# Patient Record
Sex: Male | Born: 1995
Health system: Southern US, Community
[De-identification: ages and names within clinical notes are randomized; demographics above are authoritative.]

## PROBLEM LIST (undated history)

## (undated) DIAGNOSIS — T7840XA Allergy, unspecified, initial encounter: Secondary | ICD-10-CM

## (undated) HISTORY — DX: Allergy, unspecified, initial encounter: T78.40XA

## (undated) HISTORY — PX: TONSILLECTOMY: SUR1361

## (undated) HISTORY — PX: FRACTURE SURGERY: SHX138

---
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
## 2014-06-23 ENCOUNTER — Ambulatory Visit
Admit: 2014-06-23 | Disposition: A | Discharge status: Home / Self Care | Payer: Self-pay | Attending: Surgery | Admitting: Surgery

## 2014-06-23 HISTORY — PX: OTHER SURGICAL HISTORY: SHX169
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
## 2014-08-10 ENCOUNTER — Ambulatory Visit: Payer: 59 | Attending: Surgery | Admitting: Physical Therapy

## 2014-08-10 ENCOUNTER — Encounter: Payer: Self-pay | Admitting: Physical Therapy

## 2014-08-10 DIAGNOSIS — M25672 Stiffness of left ankle, not elsewhere classified: Secondary | ICD-10-CM | POA: Diagnosis present

## 2014-08-10 DIAGNOSIS — M6281 Muscle weakness (generalized): Secondary | ICD-10-CM | POA: Diagnosis present

## 2014-08-11 ENCOUNTER — Ambulatory Visit: Payer: 59 | Admitting: Physical Therapy

## 2014-08-11 ENCOUNTER — Encounter: Payer: Self-pay | Admitting: Physical Therapy

## 2014-08-11 DIAGNOSIS — M6281 Muscle weakness (generalized): Secondary | ICD-10-CM

## 2014-08-11 DIAGNOSIS — M25672 Stiffness of left ankle, not elsewhere classified: Secondary | ICD-10-CM | POA: Diagnosis not present
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
## 2014-08-12 NOTE — Therapy (Signed)
[facility] [address] [place], [address] Phone: [phone]   Fax:  [phone]  Physical Therapy Treatment  Patient Details  Name: Kyle Foley MRN: [id number] Date of Birth: 03/23/1995 Referring Provider:  Adin Hector, MD  Encounter Date: 08/11/2014      PT End of Session - 08/11/14 1548    Visit Number 2   Number of Visits 12   Date for PT Re-Evaluation 09/22/14   PT Start Time 2778   PT Stop Time 1545   PT Time Calculation (min) 30 min      Past Medical History  Diagnosis Date  . Allergy     Past Surgical History  Procedure Laterality Date  . Left ankle orif Left 06/23/2014    There were no vitals filed for this visit.  Visit Diagnosis:  Ankle stiffness, left  Muscle weakness      Subjective Assessment - 08/11/14 1518    Subjective Rash seems to be better, still stiff in left ankle   Limitations Walking;Standing;House hold activities;Other (comment)  sports   Patient Stated Goals to return to prior level of function and improve mobility in left ankle for walking and daily tasks   Currently in Pain? No/denies     Observation: ambulating into clinic without assistive device, cast boot in place left LE, brought sneakers to change into for exercises + rash, improved from previous session along lower leg left to ankle Palpation: decreased soft tissue mobility along left ankle/scar and calf muslce       [facility] Adult PT Treatment/Exercise - 08/11/14 1530    Exercises   Exercises Other Exercises   Other Exercises  balance system for weight shifting x 2 min. side to side and forward and back with platform static and using UE for support/balance, random control exercise 2 sets each skill level with platform unlocked #7 (using UE support as needed for balance control),walk side stepping on balance foam x 1 min., walk forward and backwards with concentration on heel toe  pattern and equal step length with verbal cuing to perform correct technique and weight shifting   Manual Therapy   Manual Therapy Soft tissue mobilization   Manual therapy comments patient prone lying   Soft tissue mobilization STM posterior aspect of left ankle, calf muscle, scar mobilzation  Patient response: improved soft tissue elasticity, flexibility in Achilles tendon, improved AROM DF to 3 degrees, PF to 30 degrees and Inversion to 20 degrees          PT Education - 08/11/14 1518    Education provided Yes   Education Details Instructed in home exercises for balance exercises and walking side stepping and forwardk and backward walking   Person(s) Educated Patient   Methods Explanation;Demonstration;Verbal cues   Comprehension Verbalized understanding;Returned demonstration;Verbal cues required             PT Long Term Goals - 08/10/14 1305    PT LONG TERM GOAL #1   Title Patient will exhibit normal gait pattern with mild deviations without brace on left LE ankle by 09/02/2014   Baseline Patient is ambulating with crutches and brace on left LE   Status New   PT LONG TERM GOAL #2   Title Patient will improve LEFS to 56/80 demonstrating improved LE function with decreased pain/difficulty by 09/09/2014   Baseline LEFS 47/80   Status New   PT LONG TERM GOAL #3   Title Patient will improve left  ankle AROM to WNL's and close to right LE ankle ROM to improve abiltiy to ambulate without assistive device without deviations by 09/22/2014   Baseline AROM left ankle DF (-3), PF 20, inversion 18, eversion 15   Status New   PT LONG TERM GOAL #4   Title Patient will be independent with home program for pain/swelling control, ROM and strengthening left LE without cuing to be able to transition to self management by 09/22/2014   Baseline Patient has limited knowledge of appriate progression of exercises to improve flexiblity and strength left LE/ankle   Status New                Plan - 08/11/14 1548    Clinical Impression Statement Improved flexibility in left ankle to (+) 3 DF and 30 PF, 20 inversion and 10 eversion and improved gait pattern following treatment. He continues with limited ROM and strength and difficulty with walking and will benefit from continued physical therapy intervention to improve ROM and strength in order to achieve goals.    Pt will benefit from skilled therapeutic intervention in order to improve on the following deficits Abnormal gait;Decreased strength;Difficulty walking;Decreased range of motion;Decreased scar mobility   Rehab Potential Good   Clinical Impairments Affecting Rehab Potential (+) motivated, age, prior level of function   PT Frequency 2x / week   PT Duration 6 weeks   PT Treatment/Interventions Gait training;Manual techniques;Therapeutic exercise;Scar mobilization;Moist Heat;Patient/family education;Ultrasound   PT Next Visit Plan Instruct in AROM, standing/walking exercises        Problem List There are no active problems to display for this patient.   Jomarie Longs PT 08/12/2014, 2:28 PM  [place] [facility] [address], [place], [address] Phone: [phone]   Fax:  [phone]

## 2014-08-15 ENCOUNTER — Inpatient Hospital Stay
Admission: AD | Admit: 2014-08-15 | Discharge: 2014-08-19 | DRG: 858 | Disposition: A | Discharge status: Home / Self Care | Payer: 59 | Source: Ambulatory Visit | Attending: Surgery | Admitting: Surgery

## 2014-08-15 ENCOUNTER — Encounter: Payer: Self-pay | Admitting: *Deleted

## 2014-08-15 DIAGNOSIS — Y793 Surgical instruments, materials and orthopedic devices (including sutures) associated with adverse incidents: Secondary | ICD-10-CM | POA: Diagnosis present

## 2014-08-15 DIAGNOSIS — Z79899 Other long term (current) drug therapy: Secondary | ICD-10-CM

## 2014-08-15 DIAGNOSIS — K219 Gastro-esophageal reflux disease without esophagitis: Secondary | ICD-10-CM | POA: Diagnosis present

## 2014-08-15 DIAGNOSIS — Y838 Other surgical procedures as the cause of abnormal reaction of the patient, or of later complication, without mention of misadventure at the time of the procedure: Secondary | ICD-10-CM | POA: Diagnosis present

## 2014-08-15 DIAGNOSIS — T8149XA Infection following a procedure, other surgical site, initial encounter: Secondary | ICD-10-CM | POA: Diagnosis present

## 2014-08-15 DIAGNOSIS — T814XXA Infection following a procedure, initial encounter: Principal | ICD-10-CM | POA: Diagnosis present

## 2014-08-15 LAB — CBC
HCT: 37.7 % — ABNORMAL LOW (ref 40.0–52.0)
Hemoglobin: 12.9 g/dL — ABNORMAL LOW (ref 13.0–18.0)
MCH: 29.6 pg (ref 26.0–34.0)
MCHC: 34.1 g/dL (ref 32.0–36.0)
MCV: 86.7 fL (ref 80.0–100.0)
PLATELETS: 203 10*3/uL (ref 150–440)
RBC: 4.35 MIL/uL — ABNORMAL LOW (ref 4.40–5.90)
RDW: 12.3 % (ref 11.5–14.5)
WBC: 10.2 10*3/uL (ref 3.8–10.6)

## 2014-08-15 MED ORDER — KCL IN DEXTROSE-NACL 20-5-0.9 MEQ/L-%-% IV SOLN
INTRAVENOUS | Status: DC
  Administered 2014-08-15 – 2014-08-16 (×2): via INTRAVENOUS
  Filled 2014-08-15 (×5): qty 1000

## 2014-08-15 MED ORDER — MORPHINE SULFATE 2 MG/ML IJ SOLN
1.0000 mg | INTRAMUSCULAR | Status: DC | PRN
Start: 2014-08-15 — End: 2014-08-19
  Administered 2014-08-16: 2 mg via INTRAVENOUS
  Filled 2014-08-15: qty 1

## 2014-08-15 MED ORDER — ONDANSETRON HCL 4 MG/2ML IJ SOLN
4.0000 mg | Freq: Four times a day (QID) | INTRAMUSCULAR | Status: DC | PRN
  Administered 2014-08-18: 4 mg via INTRAVENOUS
  Filled 2014-08-15: qty 2

## 2014-08-15 MED ORDER — ACETAMINOPHEN 325 MG PO TABS: 650.0000 mg | ORAL_TABLET | Freq: Four times a day (QID) | ORAL | Status: DC | PRN

## 2014-08-15 MED ORDER — ACETAMINOPHEN 650 MG RE SUPP: 650.0000 mg | Freq: Four times a day (QID) | RECTAL | Status: DC | PRN

## 2014-08-15 MED ORDER — HYDROCODONE-ACETAMINOPHEN 5-325 MG PO TABS
1.0000 | ORAL_TABLET | ORAL | Status: DC | PRN
  Administered 2014-08-16: 2 via ORAL
  Administered 2014-08-16: 1 via ORAL
  Filled 2014-08-15: qty 1
  Filled 2014-08-15: qty 2

## 2014-08-15 MED ORDER — PANTOPRAZOLE SODIUM 40 MG IV SOLR
40.0000 mg | Freq: Every day | INTRAVENOUS | Status: DC
  Administered 2014-08-15: 40 mg via INTRAVENOUS
  Filled 2014-08-15: qty 40

## 2014-08-15 MED ORDER — CEFAZOLIN SODIUM-DEXTROSE 2-3 GM-% IV SOLR
2.0000 g | Freq: Three times a day (TID) | INTRAVENOUS | Status: DC
  Administered 2014-08-15 – 2014-08-19 (×12): 2 g via INTRAVENOUS
  Filled 2014-08-15 (×16): qty 50
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
## 2014-08-16 ENCOUNTER — Encounter: Payer: Self-pay | Admitting: Anesthesiology

## 2014-08-16 ENCOUNTER — Encounter
Admission: AD | Disposition: A | Discharge status: Home / Self Care | Payer: Self-pay | Source: Ambulatory Visit | Attending: Surgery

## 2014-08-16 ENCOUNTER — Inpatient Hospital Stay: Payer: 59 | Admitting: Certified Registered"

## 2014-08-16 HISTORY — PX: HARDWARE REMOVAL: SHX979

## 2014-08-16 SURGERY — REMOVAL, HARDWARE
Anesthesia: General | Laterality: Left | Wound class: Clean Contaminated

## 2014-08-16 MED ORDER — NEOSTIGMINE METHYLSULFATE 10 MG/10ML IV SOLN
INTRAVENOUS | Status: DC | PRN
  Administered 2014-08-16: 4 mg via INTRAVENOUS

## 2014-08-16 MED ORDER — PANTOPRAZOLE SODIUM 40 MG PO TBEC
40.0000 mg | DELAYED_RELEASE_TABLET | Freq: Every day | ORAL | Status: DC
  Administered 2014-08-17 – 2014-08-19 (×3): 40 mg via ORAL
  Filled 2014-08-16 (×3): qty 1

## 2014-08-16 MED ORDER — HYDROMORPHONE HCL 1 MG/ML IJ SOLN
0.2500 mg | INTRAMUSCULAR | Status: DC | PRN
  Administered 2014-08-16 (×4): 0.5 mg via INTRAVENOUS

## 2014-08-16 MED ORDER — OXYCODONE HCL 5 MG PO TABS
5.0000 mg | ORAL_TABLET | ORAL | Status: DC | PRN
  Administered 2014-08-16 (×2): 5 mg via ORAL
  Administered 2014-08-16 – 2014-08-19 (×12): 10 mg via ORAL
  Administered 2014-08-19: 5 mg via ORAL
  Filled 2014-08-16 (×3): qty 2
  Filled 2014-08-16 (×2): qty 1
  Filled 2014-08-16 (×4): qty 2
  Filled 2014-08-16: qty 1
  Filled 2014-08-16 (×7): qty 2

## 2014-08-16 MED ORDER — LACTATED RINGERS IV SOLN
INTRAVENOUS | Status: DC | PRN
  Administered 2014-08-16: 12:00:00 via INTRAVENOUS

## 2014-08-16 MED ORDER — GLYCOPYRROLATE 0.2 MG/ML IJ SOLN
INTRAMUSCULAR | Status: DC | PRN
  Administered 2014-08-16: 0.6 mg via INTRAVENOUS

## 2014-08-16 MED ORDER — SUCCINYLCHOLINE CHLORIDE 20 MG/ML IJ SOLN
INTRAMUSCULAR | Status: DC | PRN
  Administered 2014-08-16: 100 mg via INTRAVENOUS

## 2014-08-16 MED ORDER — FLUTICASONE PROPIONATE 50 MCG/ACT NA SUSP
2.0000 | Freq: Two times a day (BID) | NASAL | Status: DC | PRN
  Administered 2014-08-16 – 2014-08-18 (×2): 2 via NASAL
  Filled 2014-08-16: qty 16

## 2014-08-16 MED ORDER — MIDAZOLAM HCL 2 MG/2ML IJ SOLN
INTRAMUSCULAR | Status: DC | PRN
  Administered 2014-08-16: 3 mg via INTRAVENOUS

## 2014-08-16 MED ORDER — HYDROMORPHONE HCL 1 MG/ML IJ SOLN: 0.2500 mg | INTRAMUSCULAR | Status: DC | PRN

## 2014-08-16 MED ORDER — HYDROMORPHONE HCL 1 MG/ML IJ SOLN: 0.5000 mg | INTRAMUSCULAR | Status: DC | PRN

## 2014-08-16 MED ORDER — PROPOFOL 10 MG/ML IV BOLUS
INTRAVENOUS | Status: DC | PRN
  Administered 2014-08-16: 100 mg via INTRAVENOUS
  Administered 2014-08-16: 200 mg via INTRAVENOUS

## 2014-08-16 MED ORDER — FENTANYL CITRATE (PF) 100 MCG/2ML IJ SOLN
INTRAMUSCULAR | Status: DC | PRN
  Administered 2014-08-16: 50 ug via INTRAVENOUS
  Administered 2014-08-16: 100 ug via INTRAVENOUS
  Administered 2014-08-16: 50 ug via INTRAVENOUS
  Administered 2014-08-16: 100 ug via INTRAVENOUS

## 2014-08-16 MED ORDER — ONDANSETRON HCL 4 MG/2ML IJ SOLN: 4.0000 mg | Freq: Once | INTRAMUSCULAR | Status: AC | PRN

## 2014-08-16 MED ORDER — ONDANSETRON HCL 4 MG/2ML IJ SOLN
INTRAMUSCULAR | Status: DC | PRN
  Administered 2014-08-16: 4 mg via INTRAVENOUS

## 2014-08-16 MED ORDER — LIDOCAINE HCL (CARDIAC) 20 MG/ML IV SOLN
INTRAVENOUS | Status: DC | PRN
  Administered 2014-08-16: 100 mg via INTRAVENOUS

## 2014-08-16 MED ORDER — ROCURONIUM BROMIDE 100 MG/10ML IV SOLN
INTRAVENOUS | Status: DC | PRN
  Administered 2014-08-16: 10 mg via INTRAVENOUS
  Administered 2014-08-16: 20 mg via INTRAVENOUS

## 2014-08-16 MED ORDER — KCL IN DEXTROSE-NACL 20-5-0.9 MEQ/L-%-% IV SOLN
INTRAVENOUS | Status: DC
  Administered 2014-08-16 – 2014-08-17 (×2): via INTRAVENOUS
  Filled 2014-08-16 (×3): qty 1000

## 2014-08-16 MED ORDER — LORATADINE 10 MG PO TABS
10.0000 mg | ORAL_TABLET | Freq: Every day | ORAL | Status: DC
Start: 2014-08-16 — End: 2014-08-19
  Administered 2014-08-17 – 2014-08-18 (×2): 10 mg via ORAL
  Filled 2014-08-16 (×3): qty 1

## 2014-08-16 MED ORDER — NEOMYCIN-POLYMYXIN B GU 40-200000 IR SOLN
Status: DC | PRN
  Administered 2014-08-16: 1000 mL

## 2014-08-16 MED ORDER — DEXAMETHASONE SODIUM PHOSPHATE 4 MG/ML IJ SOLN
INTRAMUSCULAR | Status: DC | PRN
  Administered 2014-08-16: 10 mg via INTRAVENOUS

## 2014-08-16 SURGICAL SUPPLY — 49 items
BANDAGE ELASTIC 4 CLIP ST LF (GAUZE/BANDAGES/DRESSINGS) IMPLANT
BANDAGE ELASTIC 6 CLIP ST LF (GAUZE/BANDAGES/DRESSINGS) IMPLANT
BLADE SURG SZ10 CARB STEEL (BLADE) ×4 IMPLANT
BNDG COHESIVE 4X5 TAN STRL (GAUZE/BANDAGES/DRESSINGS) ×4 IMPLANT
BNDG ESMARK 6X12 TAN STRL LF (GAUZE/BANDAGES/DRESSINGS) ×2 IMPLANT
BNDG PLASTER FAST 4X5 WHT LF (CAST SUPPLIES) IMPLANT
CANISTER SUCT 1200ML W/VALVE (MISCELLANEOUS) ×2 IMPLANT
CHLORAPREP W/TINT 26ML (MISCELLANEOUS) IMPLANT
DRAIN PENROSE 1/4X12 LTX (DRAIN) ×2 IMPLANT
DRAPE C-ARM XRAY 36X54 (DRAPES) IMPLANT
DRAPE C-ARMOR (DRAPES) IMPLANT
DRAPE INCISE IOBAN 66X45 STRL (DRAPES) IMPLANT
DRAPE U-SHAPE 47X51 STRL (DRAPES) ×2 IMPLANT
ELECT CAUTERY BLADE 6.4 (BLADE) ×2 IMPLANT
GAUZE PETRO XEROFOAM 1X8 (MISCELLANEOUS) ×2 IMPLANT
GAUZE SPONGE 4X4 12PLY STRL (GAUZE/BANDAGES/DRESSINGS) ×2 IMPLANT
GAUZE XEROFORM 4X4 STRL (GAUZE/BANDAGES/DRESSINGS) ×2 IMPLANT
GLOVE BIO SURGEON STRL SZ8 (GLOVE) ×4 IMPLANT
GLOVE INDICATOR 8.0 STRL GRN (GLOVE) ×2 IMPLANT
GOWN STRL REUS W/ TWL LRG LVL3 (GOWN DISPOSABLE) ×1 IMPLANT
GOWN STRL REUS W/ TWL XL LVL3 (GOWN DISPOSABLE) ×2 IMPLANT
GOWN STRL REUS W/TWL LRG LVL3 (GOWN DISPOSABLE) ×1
GOWN STRL REUS W/TWL XL LVL3 (GOWN DISPOSABLE) ×2
HEMOVAC 400ML (MISCELLANEOUS)
KIT DRAIN HEMOVAC JP 7FR 400ML (MISCELLANEOUS) IMPLANT
LABEL OR SOLS (LABEL) ×2 IMPLANT
NS IRRIG 1000ML POUR BTL (IV SOLUTION) ×2 IMPLANT
PACK EXTREMITY ARMC (MISCELLANEOUS) ×2 IMPLANT
PAD ABD DERMACEA PRESS 5X9 (GAUZE/BANDAGES/DRESSINGS) ×4 IMPLANT
PAD CAST CTTN 4X4 STRL (SOFTGOODS) IMPLANT
PAD GROUND ADULT SPLIT (MISCELLANEOUS) ×2 IMPLANT
PAD PREP 24X41 OB/GYN DISP (PERSONAL CARE ITEMS) ×2 IMPLANT
PADDING CAST COTTON 4X4 STRL (SOFTGOODS)
SPONGE LAP 18X18 5 PK (GAUZE/BANDAGES/DRESSINGS) IMPLANT
STAPLER SKIN PROX 35W (STAPLE) ×2 IMPLANT
STOCKINETTE IMPERVIOUS LG (DRAPES) ×2 IMPLANT
STOCKINETTE M/LG 89821 (MISCELLANEOUS) IMPLANT
STOCKINETTE STRL 6IN 960660 (GAUZE/BANDAGES/DRESSINGS) IMPLANT
STRAP SAFETY BODY (MISCELLANEOUS) ×2 IMPLANT
SUT MON AB 3-0 SH 27 (SUTURE) ×2 IMPLANT
SUT PROLENE 0 CT 2 (SUTURE) ×2 IMPLANT
SUT PROLENE 4 0 PS 2 18 (SUTURE) IMPLANT
SUT VIC AB 0 CT1 36 (SUTURE) IMPLANT
SUT VIC AB 2-0 CT1 27 (SUTURE) ×1
SUT VIC AB 2-0 CT1 TAPERPNT 27 (SUTURE) ×1 IMPLANT
SUT VIC AB 2-0 SH 27 (SUTURE)
SUT VIC AB 2-0 SH 27XBRD (SUTURE) IMPLANT
SWAB DUAL CULTURE TRANS RED ST (MISCELLANEOUS) ×2 IMPLANT
SYRINGE 10CC LL (SYRINGE) ×2 IMPLANT

## 2014-08-16 NOTE — Anesthesia Procedure Notes (Signed)
Procedure Name: Intubation Date/Time: 08/16/2014 12:22 PM Performed by: Silvana Newness Pre-anesthesia Checklist: Patient identified, Emergency Drugs available, Suction available, Patient being monitored and Timeout performed Patient Re-evaluated:Patient Re-evaluated prior to inductionOxygen Delivery Method: Circle system utilized Preoxygenation: Pre-oxygenation with 100% oxygen Intubation Type: IV induction Ventilation: Mask ventilation without difficulty Laryngoscope Size: Mac and 4 Grade View: Grade I Tube type: Oral Tube size: 7.5 mm Number of attempts: 1 Airway Equipment and Method: Stylet Placement Confirmation: ETT inserted through vocal cords under direct vision,  positive ETCO2 and breath sounds checked- equal and bilateral Secured at: 24 cm Tube secured with: Tape Dental Injury: Teeth and Oropharynx as per pre-operative assessment

## 2014-08-16 NOTE — Anesthesia Preprocedure Evaluation (Addendum)
Anesthesia Evaluation  Patient identified by MRN, date of birth, ID band Patient awake    Reviewed: Allergy & Precautions, NPO status , Patient's Chart, lab work & pertinent test results  Airway Mallampati: III  TM Distance: <3 FB Neck ROM: Full   Comment: Small mouth Dental no notable dental hx. (+) Teeth Intact   Pulmonary neg pulmonary ROS,    Pulmonary exam normal       Cardiovascular negative cardio ROS Normal cardiovascular examRhythm:Regular Rate:Normal     Neuro/Psych negative neurological ROS  negative psych ROS   GI/Hepatic Neg liver ROS, GERD-  ,  Endo/Other  negative endocrine ROS  Renal/GU negative Renal ROS  negative genitourinary   Musculoskeletal negative musculoskeletal ROS (+)   Abdominal Normal abdominal exam  (+)   Peds negative pediatric ROS (+)  Hematology negative hematology ROS (+)   Anesthesia Other Findings Chronic sinus issues  Reproductive/Obstetrics                            Anesthesia Physical Anesthesia Plan  ASA: II  Anesthesia Plan: General   Post-op Pain Management:    Induction: Intravenous  Airway Management Planned: Oral ETT  Additional Equipment:   Intra-op Plan:   Post-operative Plan: Extubation in OR  Informed Consent: I have reviewed the patients History and Physical, chart, labs and discussed the procedure including the risks, benefits and alternatives for the proposed anesthesia with the patient or authorized representative who has indicated his/her understanding and acceptance.   Dental advisory given  Plan Discussed with: CRNA and Surgeon  Anesthesia Plan Comments:         Anesthesia Quick Evaluation

## 2014-08-16 NOTE — Transfer of Care (Signed)
Immediate Anesthesia Transfer of Care Note  Patient: Kyle Foley  Procedure(s) Performed: Procedure(s): HARDWARE REMOVAL (Left)  Patient Location: PACU  Anesthesia Type:General  Level of Consciousness: awake, alert , oriented and patient cooperative  Airway & Oxygen Therapy: Patient Spontanous Breathing and Patient connected to face mask oxygen  Post-op Assessment: Report given to RN, Post -op Vital signs reviewed and stable and Patient moving all extremities X 4  Post vital signs: Reviewed and stable  Last Vitals:  Filed Vitals:   08/16/14 1336  BP: 146/75  Pulse: 109  Temp: 37.6 C  Resp: 20    Complications: No apparent anesthesia complications

## 2014-08-16 NOTE — Anesthesia Postprocedure Evaluation (Signed)
  Anesthesia Post-op Note  Patient: Kyle Foley  Procedure(s) Performed: Procedure(s): HARDWARE REMOVAL (Left)  Anesthesia type:General  Patient location: PACU  Post pain: Pain level controlled  Post assessment: Post-op Vital signs reviewed, Patient's Cardiovascular Status Stable, Respiratory Function Stable, Patent Airway and No signs of Nausea or vomiting  Post vital signs: Reviewed and stable  Last Vitals:  Filed Vitals:   08/16/14 1643  BP: 150/74  Pulse: 87  Temp: 36.5 C  Resp: 18    Level of consciousness: awake, alert  and patient cooperative  Complications: No apparent anesthesia complications

## 2014-08-16 NOTE — Op Note (Signed)
08/15/2014 - 08/16/2014  1:36 PM  Patient:   Kyle Foley  Pre-Op Diagnosis:   Infected surgical wound status post ORIF left distal fibular fracture.  Post-Op Diagnosis:   Same  Procedure:   Irrigation and debridement with hardware removal left distal fibula.  Surgeon:   Pascal Lux, MD  Assistant:   None  Anesthesia:   General. endotracheal  Findings:   As above.  Complications:   None  EBL:   15 cc  Fluids:   650 cc crystalloid  TT:   38 minutes at 250 mmHg  Drains:   None  Closure:   #0 Proline  Implants:   None  Brief Clinical Note:   The patient is a 19 year old male who is now 7-8 weeks status post an open reduction and internal fixation of an unstable left distal fibular fracture. The patient was doing quite well until 4 days ago, when he began to notice pain, swelling, and erythema over the lateral aspect of his ankle in the area of his surgical incision. He became quite sick and began to run a temperature. The wound began to drain on Saturday. He was called in a prescription for by mouth antibiotics and then presented to the office on Monday. He was admitted for IV antibiotics and prepared for definitive management of his situation today.   Procedure: The patient was brought into the operating room and lain in the supine position. After adequate general endotracheal intubation and anesthesia were obtained, the patient's left foot and lower extremity were prepped with Betadine scrub and Betadine prep before being draped sterilely. Preoperative antibiotics were administered. The left lower extremity was exsanguinated with an Esmarch and the  Calf tourniquet inflated to 250 mmHg. The previous incision was reopened and carried down through the subcutaneous tissues to expose the plate. Abundant purulent material was identified and cultures were obtained. The hardware was removed in its entirety. The fracture itself was assessed and found to be stable, demonstrating  good interval healing. The screw holes were curetted using a small curette before the soft tissues themselves were scraped with a curette. The rind was removed from the lateral aspect of the fibula as well, using a key elevator. The wound was copiously irrigated with bacitracin saline solution before the wound was closed over a Penrose drain. Several 3-0 Monocryl interrupted sutures were used to close the deeper subcutaneous tissues while the skin was closed using #0 proline interrupted sutures. A sterile bulky dressing was applied to the wound before the patient was placed back into his cam walker boot. He was then awakened, extubated, and returned to the recovery room in satisfactory condition after tolerating the procedure well.

## 2014-08-16 NOTE — Plan of Care (Signed)
Problem: Problem: Pain Management Progression Goal: Pain Controlled (History of Substance Abuse) Outcome: Completed/Met Date Met:  08/16/14 No substance abuse

## 2014-08-16 NOTE — Care Management (Signed)
Met with patient and his dad to discuss discharge planning. Patient denies needs at this time. He has crutches available for mobility. He uses [facility] and denies difficulty obtaining meds. He is aware at that pharmacy is closed on weekend. He denies need for home health. RNCM will continue to follow.

## 2014-08-17 ENCOUNTER — Ambulatory Visit: Payer: 59 | Admitting: Physical Therapy

## 2014-08-17 MED ORDER — DOCUSATE SODIUM 100 MG PO CAPS
100.0000 mg | ORAL_CAPSULE | Freq: Two times a day (BID) | ORAL | Status: DC
  Administered 2014-08-17 – 2014-08-19 (×4): 100 mg via ORAL
  Filled 2014-08-17 (×4): qty 1

## 2014-08-17 MED ORDER — MAGNESIUM HYDROXIDE 400 MG/5ML PO SUSP
30.0000 mL | Freq: Every day | ORAL | Status: DC | PRN
  Administered 2014-08-17 – 2014-08-18 (×2): 30 mL via ORAL
  Filled 2014-08-17 (×2): qty 30

## 2014-08-17 MED FILL — Sodium Chloride Irrigation Soln 0.9%: Qty: 1000 | Status: AC

## 2014-08-17 MED FILL — Neomycin-Polymyxin B GU Irrigation Soln: Qty: 1 | Status: AC

## 2014-08-17 NOTE — Progress Notes (Signed)
  Subjective: 1 Day Post-Op Procedure(s) (LRB): HARDWARE REMOVAL (Left) Patient reports pain as moderate.   Patient seen in rounds with Dr. Roland Rack. Patient is well, and has had no acute complaints or problems. It has been getting up with crutches to go the bathroom Plan is to go Home after hospital stay.  Plan is to go Friday after the IV antibiotics are completed. Negative for chest pain and shortness of breath Fever: no Gastrointestinal:negative for nausea and vomiting  Objective: Vital signs in last 24 hours: Temp:  [97.7 F (36.5 C)-99.6 F (37.6 C)] 98.2 F (36.8 C) (06/08 0605) Pulse Rate:  [63-125] 63 (06/08 0605) Resp:  [18-20] 18 (06/08 0605) BP: (137-158)/(58-77) 137/62 mmHg (06/08 0605) SpO2:  [98 %-100 %] 99 % (06/08 0605) FiO2 (%):  [28 %] 28 % (06/07 1348)  Intake/Output from previous day:  Intake/Output Summary (Last 24 hours) at 08/17/14 0731 Last data filed at 08/16/14 2328  Gross per 24 hour  Intake   1000 ml  Output   1955 ml  Net   -955 ml    Intake/Output this shift:    Labs:  Recent Labs  08/15/14 1617  HGB 12.9*    Recent Labs  08/15/14 1617  WBC 10.2  RBC 4.35*  HCT 37.7*  PLT 203   No results for input(s): NA, K, CL, CO2, BUN, CREATININE, GLUCOSE, CALCIUM in the last 72 hours. No results for input(s): LABPT, INR in the last 72 hours.   EXAM General - Patient is Alert and Oriented Extremity - Neurologically intact Sensation intact distally Dressing/Incision - clean, blood tinged drainage on Ace wrap, that is minimal. Motor Function - intact, moving foot and toes well on exam.   Past Medical History  Diagnosis Date  . Allergy     Assessment/Plan: 1 Day Post-Op Procedure(s) (LRB): HARDWARE REMOVAL (Left) Active Problems:   Wound, surgical, infected  Estimated body mass index is 26.27 kg/(m^2) as calculated from the following:   Height as of this encounter: 5\' 9"  (1.753 m).   Weight as of this encounter: 80.74 kg (178  lb). Advance diet Continue ABX therapy due to Post-op infection  DVT Prophylaxis - None Weight-Bearing as tolerated to left leg  Reche Dixon, PA-C Orthopaedic Surgery 08/17/2014, 7:31 AM

## 2014-08-17 NOTE — Evaluation (Signed)
Physical Therapy Evaluation Patient Details Name: Kyle Foley MRN: [id number] DOB: 1995-04-22 Today's Date: 08/17/2014   History of Present Illness  Patient is a 19 y/o male that presents s/p left fibular hardware removal secondary to infection. Patient fractured his left fibula and had hardware placed on 06/23/14 and had just begun outpatient PT prior to developing significant pain in his LLE.   Clinical Impression  Patient is a 19 y/o male that presents after hardware removal from initial injury on 06/23/14. He is modified independent with crutches currently as he does not want to weight bear on his LLE secondary to pain, though he is considered WBAT. Patient displays excellent mechanics with mobility with crutches, and is safe with all transfers. Patient would continue to benefit from skilled PT services to address his mobility and balance deficits secondary to pain and increase his tolerance for weight bearing through his LLE.     Follow Up Recommendations Home health PT (May be viable candidate for Outpatient pending his tolerance for weight bearing)    Equipment Recommendations       Recommendations for Other Services       Precautions / Restrictions Precautions Precautions: None Restrictions Weight Bearing Restrictions: Yes LLE Weight Bearing: Weight bearing as tolerated      Mobility  Bed Mobility Overal bed mobility: Independent                Transfers Overall transfer level: Modified independent Equipment used: Crutches                Ambulation/Gait Ambulation/Gait assistance: Modified independent (Device/Increase time) Ambulation Distance (Feet): 200 Feet Assistive device: Crutches Gait Pattern/deviations: WFL(Within Functional Limits) (Hop through pattern with RLE and crutches )     General Gait Details: Patient was mod I with cruthces and did not want to weight bear on LLE secondary to pain   Stairs            Wheelchair  Mobility    Modified Rankin (Stroke Patients Only)       Balance Overall balance assessment: Independent                                           Pertinent Vitals/Pain Pain Assessment:  (Patient does not rate pain at start, but does not want to weight bear on LLE)    Home Living Family/patient expects to be discharged to:: Private residence Living Arrangements: Parent Available Help at Discharge: Family Type of Home: House Home Access: Stairs to enter Entrance Stairs-Rails: None Entrance Stairs-Number of Steps: 2 Home Layout: Two level Home Equipment: Crutches      Prior Function Level of Independence: Independent         Comments: Had just been cleared to ambulate without crutches last week, but prior to that was independent with cruthces.      Hand Dominance        Extremity/Trunk Assessment   Upper Extremity Assessment: Overall WFL for tasks assessed           Lower Extremity Assessment: Overall WFL for tasks assessed      Cervical / Trunk Assessment: Normal  Communication   Communication: No difficulties  Cognition Arousal/Alertness: Awake/alert Behavior During Therapy: WFL for tasks assessed/performed Overall Cognitive Status: Within Functional Limits for tasks assessed  General Comments      Exercises Total Joint Exercises Straight Leg Raises: AROM;Both;15 reps Bridges: AROM;Both;15 reps General Exercises - Lower Extremity Long Arc Quad: AROM;Both;15 reps Heel Slides: AROM;Both;15 reps      Assessment/Plan    PT Assessment Patient needs continued PT services  PT Diagnosis Difficulty walking;Abnormality of gait;Acute pain   PT Problem List Decreased strength;Pain;Decreased mobility;Decreased balance  PT Treatment Interventions Therapeutic activities;Therapeutic exercise;Gait training;Stair training   PT Goals (Current goals can be found in the Care Plan section) Acute Rehab PT  Goals PT Goal Formulation: With patient Time For Goal Achievement: 08/31/14 Potential to Achieve Goals: Good    Frequency BID   Barriers to discharge        Co-evaluation               End of Session Equipment Utilized During Treatment: Gait belt Activity Tolerance: Patient tolerated treatment well Patient left: in bed;with bed alarm set;with call bell/phone within reach;with family/visitor present Nurse Communication: Mobility status         Time: 7628-3151 PT Time Calculation (min) (ACUTE ONLY): 17 min   Charges:   PT Evaluation $Initial PT Evaluation Tier I: 1 Procedure PT Treatments $Therapeutic Exercise: 8-22 mins   PT G Codes:       Kerman Passey, PT, DPT   08/17/2014, 1:30 PM

## 2014-08-17 NOTE — Progress Notes (Signed)
Physical Therapy Treatment Patient Details Name: Kyle Foley MRN: [id number] DOB: [birth year] Today's Date: 08/17/2014    History of Present Illness Patient is a 19 y/o male that presents s/p left fibular hardware removal secondary to infection. Patient fractured his left fibula and had hardware placed on 06/23/14 and had just begun outpatient PT prior to developing significant pain in his LLE.     PT Comments    Patient was able to tolerate weight bearing better this afternoon, though he is still hesitant to ambulate with WB on RLE. He was able to complete 1 lap with WB on LLE with crutches with no loss of balance mod I. Patient ascended and descended 12 steps with either bilateral railing or his crutches with cga x 1 for safety. Patient would benefit from additional there-ex programming now that he is tolerating weight bearing on his LLE. Skilled acute PT services are indicated to continue to address his change in mobility status after hardware removal.  Follow Up Recommendations  Home health PT     Equipment Recommendations       Recommendations for Other Services       Precautions / Restrictions Precautions Precautions: None Restrictions Weight Bearing Restrictions: Yes LLE Weight Bearing: Weight bearing as tolerated    Mobility  Bed Mobility Overal bed mobility: Independent                Transfers Overall transfer level: Modified independent Equipment used: Crutches                Ambulation/Gait Ambulation/Gait assistance: Modified independent (Device/Increase time) Ambulation Distance (Feet): 500 Feet Assistive device: Crutches Gait Pattern/deviations: WFL(Within Functional Limits)     General Gait Details: Patient completed 1 full lap around RN station while partially weightbearing through LLE, though limited by pain. Initially patient preferred to ambulate only with LLE in hop to gait pattern.    Stairs Stairs: Yes Stairs assistance:  Modified independent (Device/Increase time) Stair Management: Two rails;With crutches Number of Stairs: 12 General stair comments: Patient able to complete with TTWB on LLE for third set of 4 steps and appeared much safer than with her hand rails or crutches on first 2 attempts.   Wheelchair Mobility    Modified Rankin (Stroke Patients Only)       Balance Overall balance assessment: Independent                                  Cognition Arousal/Alertness: Awake/alert Behavior During Therapy: WFL for tasks assessed/performed Overall Cognitive Status: Within Functional Limits for tasks assessed                      Exercises Total Joint Exercises Straight Leg Raises: AROM;Both;15 reps Bridges: AROM;15 reps General Exercises - Lower Extremity Long Arc Quad: AROM;Both;15 reps Heel Slides: AROM;Both;15 reps    General Comments        Pertinent Vitals/Pain Pain Assessment: 0-10 Pain Score: 4  Pain Location: Left ankle/foot region  Pain Intervention(s): Monitored during session;Utilized relaxation techniques;Limited activity within patient's tolerance    Home Living Family/patient expects to be discharged to:: Private residence Living Arrangements: Parent Available Help at Discharge: Family Type of Home: House Home Access: Stairs to enter Entrance Stairs-Rails: None Home Layout: Two level Home Equipment: Crutches      Prior Function Level of Independence: Independent      Comments: Had just been cleared to ambulate  without crutches last week, but prior to that was independent with cruthces.    PT Goals (current goals can now be found in the care plan section) Acute Rehab PT Goals PT Goal Formulation: With patient Time For Goal Achievement: 08/31/14 Potential to Achieve Goals: Good Progress towards PT goals: Progressing toward goals    Frequency  BID    PT Plan Current plan remains appropriate    Co-evaluation             End  of Session Equipment Utilized During Treatment: Gait belt Activity Tolerance: Patient tolerated treatment well Patient left: in bed;with bed alarm set;with call bell/phone within reach;with family/visitor present     Time: 1521-1540 PT Time Calculation (min) (ACUTE ONLY): 19 min  Charges:  $Gait Training: 8-22 mins                    G Codes:     Kerman Passey, PT, DPT   08/17/2014, 5:02 PM

## 2014-08-17 NOTE — Progress Notes (Signed)
Notified Dr Tamala Julian for medication for constipation. Colace and MOM ordered

## 2014-08-18 ENCOUNTER — Ambulatory Visit: Payer: 59 | Admitting: Physical Therapy

## 2014-08-18 MED FILL — Neomycin-Polymyxin B GU Irrigation Soln: Qty: 4 | Status: AC

## 2014-08-18 MED FILL — Sodium Chloride Irrigation Soln 0.9%: Qty: 1000 | Status: AC

## 2014-08-18 NOTE — Progress Notes (Signed)
Physical Therapy Treatment Patient Details Name: Kyle Foley MRN: [id number] DOB: February 24, 1996 Today's Date: 08/18/2014    History of Present Illness Patient is a 19 y/o male that presents s/p left fibular hardware removal secondary to infection. Patient fractured his left fibula and had hardware placed on 06/23/14 and had just begun outpatient PT prior to developing significant pain in his LLE.     PT Comments    Patient is able to weight bear through his LLE much better than previous sessions and provides heel strike and rolls through his left foot during gait. He has excellent balance and speed with crutches and does not have any fatigue during ambulation. He continues to struggle with single leg weight bearing with exercises, secondary to tenderness. Skilled acute PT services are indicated to address the above deficits.   Follow Up Recommendations  Outpatient PT     Equipment Recommendations       Recommendations for Other Services       Precautions / Restrictions Precautions Precautions: None Restrictions Weight Bearing Restrictions: Yes LLE Weight Bearing: Weight bearing as tolerated    Mobility  Bed Mobility Overal bed mobility: Independent                Transfers Overall transfer level: Modified independent Equipment used: Crutches                Ambulation/Gait Ambulation/Gait assistance: Modified independent (Device/Increase time) Ambulation Distance (Feet): 500 Feet Assistive device: Crutches       General Gait Details: Patient displayed heel strike and was able to roll through his LLE during stance phase. He still flexes his knee during stance to compensate for the boot, but appears more [facility] during this session than previous.    Stairs            Wheelchair Mobility    Modified Rankin (Stroke Patients Only)       Balance Overall balance assessment: Independent                                  Cognition  Arousal/Alertness: Awake/alert Behavior During Therapy: WFL for tasks assessed/performed Overall Cognitive Status: Within Functional Limits for tasks assessed                      Exercises Total Joint Exercises Straight Leg Raises: AROM;Both;20 reps Bridges: AROM;Both;20 reps General Exercises - Lower Extremity Hip Flexion/Marching: AROM;Both;10 reps Other Exercises Other Exercises: Standing hip extensions x 10 bilaterally  Other Exercises: Forward weight bearing with retro weight bearing in modified tandem stance to encourage weight shifting.     General Comments        Pertinent Vitals/Pain Pain Assessment:  (Patient states he feels better than earlier, though he still has some pain with weight bearing. ) Pain Location: left ankle/foot when weight bearing Pain Intervention(s): Limited activity within patient's tolerance;Monitored during session    Home Living                      Prior Function            PT Goals (current goals can now be found in the care plan section) Acute Rehab PT Goals PT Goal Formulation: With patient Time For Goal Achievement: 08/31/14 Potential to Achieve Goals: Good Progress towards PT goals: Progressing toward goals    Frequency  BID    PT Plan Current plan remains  appropriate    Co-evaluation             End of Session Equipment Utilized During Treatment: Gait belt Activity Tolerance: Patient tolerated treatment well Patient left: in bed;with call bell/phone within reach;with family/visitor present     Time: 7939-0300 PT Time Calculation (min) (ACUTE ONLY): 17 min  Charges:  $Gait Training: 8-22 mins                    G Codes:      Kerman Passey, PT, DPT   08/18/2014, 4:21 PM

## 2014-08-18 NOTE — Progress Notes (Addendum)
  Subjective: 2 Days Post-Op Procedure(s) (LRB): HARDWARE REMOVAL (Left) Patient reports pain as mild.   Patient seen in rounds with Dr. Roland Rack. Patient is well, and has had no acute complaints or problems Plan is to go Home after hospital stay. The plan is to finish the IV antibiotics tomorrow and go home on Friday. Negative for chest pain and shortness of breath Fever: no Gastrointestinal:negative for nausea and vomiting  Objective: Vital signs in last 24 hours: Temp:  [97.7 F (36.5 C)-98 F (36.7 C)] 98 F (36.7 C) (06/09 0357) Pulse Rate:  [60-100] 65 (06/09 0357) Resp:  [16-18] 18 (06/09 0357) BP: (106-148)/(63-78) 123/66 mmHg (06/09 0357) SpO2:  [98 %-100 %] 98 % (06/09 0357)  Intake/Output from previous day:  Intake/Output Summary (Last 24 hours) at 08/18/14 0646 Last data filed at 08/17/14 1200  Gross per 24 hour  Intake    240 ml  Output      0 ml  Net    240 ml    Intake/Output this shift:    Labs:  Recent Labs  08/15/14 1617  HGB 12.9*    Recent Labs  08/15/14 1617  WBC 10.2  RBC 4.35*  HCT 37.7*  PLT 203   No results for input(s): NA, K, CL, CO2, BUN, CREATININE, GLUCOSE, CALCIUM in the last 72 hours. No results for input(s): LABPT, INR in the last 72 hours.   EXAM General - Patient is Alert and Oriented Extremity - Neurovascular intact Sensation intact distally Dorsiflexion/Plantar flexion intact Dressing/Incision - boot is intact. The drain was Advanced yesterday by Dr. Roland Rack. Motor Function - intact, moving foot and toes well on exam.   Past Medical History  Diagnosis Date  . Allergy     Assessment/Plan: 2 Days Post-Op Procedure(s) (LRB): HARDWARE REMOVAL (Left) Active Problems:   Wound, surgical, infected  Estimated body mass index is 26.27 kg/(m^2) as calculated from the following:   Height as of this encounter: 5\' 9"  (1.753 m).   Weight as of this encounter: 80.74 kg (178 lb). Continue ABX therapy due to Post-op infection.     DVT Prophylaxis - None Weight-Bearing as tolerated to left leg  Reche Dixon, PA-C Orthopaedic Surgery 08/18/2014, 6:46 AM  Dressing changed and drain advanced this morning.  Will continue IV antibiotics through today pending pre-op/intra-op culture results before switching him to po abx.  Plan on removing PR drain completely in AM and d/c'ing home tomorrow (Friday) on po abx x one month.  Pt. to continue with PT and progress to WBAT in CAM walker boot.

## 2014-08-18 NOTE — Progress Notes (Signed)
Physical Therapy Treatment Patient Details Name: Kyle Foley MRN: [id number] DOB: 1995/05/27 Today's Date: 08/18/2014    History of Present Illness Patient is a 19 y/o male that presents s/p left fibular hardware removal secondary to infection. Patient fractured his left fibula and had hardware placed on 06/23/14 and had just begun outpatient PT prior to developing significant pain in his LLE.     PT Comments    Patient is tolerating weight bearing during ambulation on LLE much better than yesterday. PT provided therapeutic neuroscience education (TNE) to patient, which was well received. TNE highlighted that pain is a protective alarm system and after an injury/surgery detection thresholds for nociceptors can decrease and cause him to perceive more pain in the area. Patient was encouraged to bear more weight through his LLE to increase the detection threshold for the nociceptors, and stated today that weight bearing was more comfortable than yesterday. Patient will live on the first floor when he returns home, and appears safe to exit and enter the home. Skilled acute PT services are indicated to continue to address his gait and balance deficits from his operation.   Follow Up Recommendations  Outpatient PT     Equipment Recommendations       Recommendations for Other Services       Precautions / Restrictions Restrictions Weight Bearing Restrictions: Yes LLE Weight Bearing: Weight bearing as tolerated    Mobility  Bed Mobility Overal bed mobility: Independent                Transfers Overall transfer level: Modified independent Equipment used: Crutches                Ambulation/Gait Ambulation/Gait assistance: Modified independent (Device/Increase time) Ambulation Distance (Feet): 400 Feet Assistive device: Crutches Gait Pattern/deviations:  (Patient maintained knee flexion during gait on LLE secondary to pain and CAM boot extending his left leg length.  )     General Gait Details: Patient ambulated with crutches and WBAT through LLE. Patient encouraged to progressively bear more and more weight through the LLE and to begin reciprocal gait pattern to increase WB on LLE.    Stairs Stairs: Yes Stairs assistance: Supervision Stair Management: With crutches Number of Stairs: 4 General stair comments: Patient able to complete steps with WBAT on LLE and crutches. He is less steady with descent secondary to increased discomfort with weight bearing   Wheelchair Mobility    Modified Rankin (Stroke Patients Only)       Balance Overall balance assessment: Independent                                  Cognition Arousal/Alertness: Awake/alert Behavior During Therapy: WFL for tasks assessed/performed Overall Cognitive Status: Within Functional Limits for tasks assessed                      Exercises Total Joint Exercises Heel Slides: AROM;Both;20 reps Hip ABduction/ADduction: AROM;Both;20 reps Straight Leg Raises: AROM;Both;20 reps Long Arc Quad: AROM;Both;20 reps Bridges: AROM;Both;20 reps    General Comments        Pertinent Vitals/Pain Pain Assessment: 0-10 Pain Score: 4  Pain Location: Left ankle/foot when Weight bearing  Pain Intervention(s): Limited activity within patient's tolerance;Monitored during session;Utilized relaxation techniques;Ice applied;Premedicated before session    Home Living  Prior Function            PT Goals (current goals can now be found in the care plan section) Acute Rehab PT Goals PT Goal Formulation: With patient Time For Goal Achievement: 08/31/14 Potential to Achieve Goals: Good Progress towards PT goals: Progressing toward goals    Frequency  BID    PT Plan Current plan remains appropriate    Co-evaluation             End of Session Equipment Utilized During Treatment: Gait belt Activity Tolerance: Patient tolerated  treatment well Patient left: in bed;with call bell/phone within reach;with family/visitor present     Time: 3212-2482 PT Time Calculation (min) (ACUTE ONLY): 27 min  Charges:  $Gait Training: 8-22 mins $Therapeutic Exercise: 8-22 mins                    G Codes:      Kerman Passey, PT, DPT   08/18/2014, 9:54 AM

## 2014-08-18 NOTE — Progress Notes (Signed)
Initial Nutrition Assessment  DOCUMENTATION CODES:     INTERVENTION:   (Meals and Snacks: Cater to patient preferences)  NUTRITION DIAGNOSIS:   (No nutrition concerns at this time)   GOAL:  Patient will meet greater than or equal to 90% of their needs  MONITOR:   (Energy Intake, Electrolyte and renal Profile)  REASON FOR ASSESSMENT:  Malnutrition Screening Tool    ASSESSMENT:  Reason For Admission: I and D PMHx:  Past Medical History  Diagnosis Date  . Allergy     Typical Fluid/ Food Intake: Bites of food noted per i/o; per patient, he is eating food from outside the hospital Meal/ Snack Patterns: Patient reports a good appetite and intake of a regular diet with no restrictions prior to admission. Does report having a decreased appetite for a couple of days prior to admission due to not feeling well.  Supplements: None  Labs: reviewed Meds: reviewed  Physical Findings: n/a Weight Changes: Patient reports a UBW range of 180-185#. No significant weight changes to note at this time.  Height:  Ht Readings from Last 1 Encounters:  08/15/14 5\' 9"  (1.753 m)    Weight:  Wt Readings from Last 1 Encounters:  08/15/14 178 lb (80.74 kg)    Ideal Body Weight:     Wt Readings from Last 10 Encounters:  08/15/14 178 lb (80.74 kg)    BMI:  Body mass index is 26.27 kg/(m^2).  Skin:  Reviewed, no issues  Diet Order:  Diet regular Room service appropriate?: Yes; Fluid consistency:: Thin  EDUCATION NEEDS:  No education needs identified at this time   Intake/Output Summary (Last 24 hours) at 08/18/14 1102 Last data filed at 08/18/14 0900  Gross per 24 hour  Intake    240 ml  Output      0 ml  Net    240 ml    Last BM:  6/6  Roda Shutters, RDN Pager: [phone] Office: [place]

## 2014-08-19 MED ORDER — OXYCODONE HCL 5 MG PO TABS: 5.0000 mg | ORAL_TABLET | ORAL | Status: AC | PRN

## 2014-08-19 MED ORDER — SULFAMETHOXAZOLE-TRIMETHOPRIM 800-160 MG PO TABS: 1.0000 | ORAL_TABLET | Freq: Two times a day (BID) | ORAL | Status: DC

## 2014-08-19 MED ORDER — DOXYCYCLINE HYCLATE 100 MG PO TABS
100.0000 mg | ORAL_TABLET | Freq: Two times a day (BID) | ORAL | Status: DC
Start: 2014-08-19 — End: 2020-09-03

## 2014-08-19 MED ORDER — DOXYCYCLINE HYCLATE 100 MG PO TABS
100.0000 mg | ORAL_TABLET | Freq: Two times a day (BID) | ORAL | Status: DC
  Administered 2014-08-19: 100 mg via ORAL
  Filled 2014-08-19: qty 1

## 2014-08-19 MED ORDER — ACETAMINOPHEN 325 MG PO TABS: 650.0000 mg | ORAL_TABLET | Freq: Four times a day (QID) | ORAL | Status: AC | PRN

## 2014-08-19 MED ORDER — CEPHALEXIN 500 MG PO CAPS
500.0000 mg | ORAL_CAPSULE | Freq: Four times a day (QID) | ORAL | Status: DC
  Administered 2014-08-19: 500 mg via ORAL
  Filled 2014-08-19: qty 1

## 2014-08-19 MED ORDER — OXYCODONE HCL 5 MG PO TABS: 5.0000 mg | ORAL_TABLET | ORAL | Status: DC | PRN

## 2014-08-19 MED ORDER — CEPHALEXIN 500 MG PO CAPS: 500.0000 mg | ORAL_CAPSULE | Freq: Four times a day (QID) | ORAL | Status: DC

## 2014-08-19 NOTE — Progress Notes (Signed)
Patient VSS this shift. Per Dr. Roland Rack, gave last IV abx. Walking well with crutches, ambulating to bathroom and in room. D/C today to home and to continue with outpatient PT. Belongings packed and family here to take home.   Discharge & RX instructions given. IV removed

## 2014-08-19 NOTE — Discharge Instructions (Signed)
NSTRUCTIONS AFTER Surgery  o Remove items at home which could result in a fall. This includes throw rugs or furniture in walking pathways o ICE to the affected joint every three hours while awake for 30 minutes at a time, for at least the first 3-5 days, and then as needed for pain and swelling.  Continue to use ice for pain and swelling. You may notice swelling that will progress down to the foot and ankle.  This is normal after surgery.  Elevate your leg when you are not up walking on it.     DIET:  As you were doing prior to hospitalization, we recommend a well-balanced diet.  DRESSING / WOUND CARE / SHOWERING  Apply new dressing as needed at home Can shower as long as dressing is covered  ACTIVITY  o Increase activity slowly as tolerated, but follow the weight bearing instructions below.   o No driving for 6 weeks or until further direction given by your physician.  You cannot drive while taking narcotics.  o No lifting or carrying greater than 10 lbs. until further directed by your surgeon. o Avoid periods of inactivity such as sitting longer than an hour when not asleep. This helps prevent blood clots.  o You may return to work once you are authorized by your doctor.     WEIGHT BEARING  Weight bear as tolerated on left ankle Use crutches as needed for ambulation   EXERCISES Continue at home exercises Continue outpatient PT in one week  CONSTIPATION  Constipation is defined medically as fewer than three stools per week and severe constipation as less than one stool per week.  Even if you have a regular bowel pattern at home, your normal regimen is likely to be disrupted due to multiple reasons following surgery.  Combination of anesthesia, postoperative narcotics, change in appetite and fluid intake all can affect your bowels.   YOU MUST use at least one of the following options; they are listed in order of increasing strength to get the job done.  They are all available over  the counter, and you may need to use some, POSSIBLY even all of these options:    Drink plenty of fluids (prune juice may be helpful) and high fiber foods Colace 100 mg by mouth twice a day  Senokot for constipation as directed and as needed Dulcolax (bisacodyl), take with full glass of water  Miralax (polyethylene glycol) once or twice a day as needed.  If you have tried all these things and are unable to have a bowel movement in the first 3-4 days after surgery call either your surgeon or your primary doctor.    If you experience loose stools or diarrhea, hold the medications until you stool forms back up.  If your symptoms do not get better within 1 week or if they get worse, check with your doctor.  If you experience "the worst abdominal pain ever" or develop nausea or vomiting, please contact the office immediately for further recommendations for treatment.   ITCHING:  If you experience itching with your medications, try taking only a single pain pill, or even half a pain pill at a time.  You can also use Benadryl over the counter for itching or also to help with sleep.   TED HOSE STOCKINGS:  Use stockings on both legs until for at least 2 weeks or as directed by physician office. They may be removed at night for sleeping.  MEDICATIONS:  See your medication summary  on the After Visit Summary that nursing will review with you.  You may have some home medications which will be placed on hold until you complete the course of blood thinner medication.  It is important for you to complete the blood thinner medication as prescribed.  PRECAUTIONS:  If you experience chest pain or shortness of breath - call 911 immediately for transfer to the hospital emergency department.   If you develop a fever greater that 101 F, purulent drainage from wound, increased redness or drainage from wound, foul odor from the wound/dressing, or calf pain - CONTACT YOUR SURGEON.                                                    FOLLOW-UP APPOINTMENTS:  If you do not already have a post-op appointment, please call the office for an appointment to be seen by your surgeon.  Guidelines for how soon to be seen are listed in your After Visit Summary, but are typically between 1-4 weeks after surgery.  OTHER INSTRUCTIONS:   MAKE SURE YOU:   Understand these instructions.   Get help right away if you are not doing well or get worse.    Thank you for letting us be a part of your medical care team.  It is a privilege we respect greatly.  We hope these instructions will help you stay on track for a fast and full recovery!

## 2014-08-19 NOTE — Progress Notes (Signed)
   Subjective: 3 Days Post-Op Procedure(s) (LRB): HARDWARE REMOVAL (Left)  Patient seen on rounds with Dr. Roland Rack Patient reports pain as mild.   Patient is well, and has had no acute complaints or problems Tolerating PT well, can ambulate without fatigue. Plan is to go Home today. Denies SOB or nausea/vomiting  Objective: Vital signs in last 24 hours: Temp:  [97.3 F (36.3 C)-98.1 F (36.7 C)] 98.1 F (36.7 C) (06/10 0441) Pulse Rate:  [61-93] 61 (06/10 0442) Resp:  [16-18] 18 (06/10 0441) BP: (121-144)/(46-72) 130/54 mmHg (06/10 0442) SpO2:  [98 %-100 %] 98 % (06/10 0441)  Intake/Output from previous day: 06/09 0701 - 06/10 0700 In: 240 [P.O.:240] Out: -  Intake/Output this shift:    No results for input(s): HGB in the last 72 hours. No results for input(s): WBC, RBC, HCT, PLT in the last 72 hours. No results for input(s): NA, K, CL, CO2, BUN, CREATININE, GLUCOSE, CALCIUM in the last 72 hours. No results for input(s): LABPT, INR in the last 72 hours.  EXAM General - Patient is Alert, Appropriate and Oriented Extremity - Neurologically intact Neurovascular intact Dorsiflexion/Plantar flexion intact Incision: scant drainage Dressing - scant drainage  Drain was removed with new dressing applied Motor Function - intact, moving foot and toes well on exam.  Past Medical History  Diagnosis Date  . Allergy     Assessment/Plan:   3 Days Post-Op Procedure(s) (LRB): HARDWARE REMOVAL (Left) Active Problems:   Wound, surgical, infected  Estimated body mass index is 26.27 kg/(m^2) as calculated from the following:   Height as of this encounter: 5\' 9"  (1.753 m).   Weight as of this encounter: 80.74 kg (178 lb). Plan: Discharge home today on oral abx  DVT Prophylaxis - None Weight-Bearing as tolerated to Right leg   J. Cameron Proud, PA-C [place] 08/19/2014, 6:33 AM

## 2014-08-19 NOTE — Discharge Summary (Signed)
Discharge Summary Patient ID: Kyle Foley MRN: [id number] DOB/AGE: March 08, 1996 19 y.o.  Admit date: 08/15/2014 Discharge date: 08/19/2014  Admission Diagnoses:  I and D lt ankle hardware removal  left ankle   Discharge Diagnoses: Patient Active Problem List   Diagnosis Date Noted  . Wound, surgical, infected 08/15/2014    Past Medical History  Diagnosis Date  . Allergy      Transfusion:  None   Discharged Condition: Stable, Improved  Hospital Course: Kyle Foley is an 19 y.o. male who was admitted 08/15/2014 with a diagnosis of infected surgical wound status post ORIF left distal fibular fracture. and went to the operating room on 08/16/2014 and underwent the above named procedures.  He has improved each day, weight bearing as tolerated with PT.  Ambulating well with crutches.  Drain was advanced daily and removed on POD3.  Wound culture demonstrated growth of Staph Aureus.   Patient's IV was d/c on day #2.  IV Abx continued until Day #3.      Surgeries: Procedure(s): HARDWARE REMOVAL on 08/15/2014 - 08/16/2014 Patient tolerated the surgery well. Taken to PACU where she was stabilized and then transferred to the orthopedic floor.   Heels elevated on bed with rolled towels. No evidence of DVT. Negative Homan. Physical therapy started on day #1 for gait training and transfer. OT started day #1 for ADL and assisted devices.   He was given perioperative antibiotics:      Anti-infectives    Start     Dose/Rate Route Frequency Ordered Stop   08/19/14 1000  sulfamethoxazole-trimethoprim (BACTRIM DS,SEPTRA DS) 800-160 MG per tablet 1 tablet  Status:  Discontinued     1 tablet Oral Every 12 hours 08/19/14 0743 08/19/14 0751   08/19/14 1000  doxycycline (VIBRA-TABS) tablet 100 mg     100 mg Oral Every 12 hours 08/19/14 0751 09/18/14 0959   08/19/14 0800  cephALEXin (KEFLEX) capsule 500 mg     500 mg Oral 4  times per day 08/19/14 0751 09/18/14 0559   08/19/14 0000  cephALEXin (KEFLEX) 500 MG capsule     500 mg Oral Every 6 hours 08/19/14 0753     08/19/14 0000  doxycycline (VIBRA-TABS) 100 MG tablet     100 mg Oral Every 12 hours 08/19/14 0753     08/15/14 1545  ceFAZolin (ANCEF) IVPB 2 g/50 mL premix     2 g 100 mL/hr over 30 Minutes Intravenous 3 times per day 08/15/14 1537      .   He benefited maximally from the hospital stay and there were no complications.    Recent vital signs:  Filed Vitals:   08/19/14 0442  BP: 130/54  Pulse: 61  Temp:   Resp:     Recent laboratory studies:  Lab Results  Component Value Date   HGB 12.9* 08/15/2014   Lab Results  Component Value Date   WBC 10.2 08/15/2014   PLT 203 08/15/2014    Discharge Medications:     Medication List    TAKE these medications        acetaminophen 325 MG tablet  Commonly known as:  TYLENOL  Take  2 tablets (650 mg total) by mouth every 6 (six) hours as needed for mild pain (or Temp > 100).     cephALEXin 500 MG capsule  Commonly known as:  KEFLEX  Take 1 capsule (500 mg total) by mouth every 6 (six) hours.     doxycycline 100 MG tablet  Commonly known as:  VIBRA-TABS  Take 1 tablet (100 mg total) by mouth every 12 (twelve) hours.     fexofenadine 180 MG tablet  Commonly known as:  ALLEGRA  Take 180 mg by mouth daily.     fluticasone 50 MCG/ACT nasal spray  Commonly known as:  FLONASE  Place 2 sprays into both nostrils 2 (two) times daily as needed for rhinitis.     oxyCODONE 5 MG immediate release tablet  Commonly known as:  Oxy IR/ROXICODONE  Take 1-2 tablets (5-10 mg total) by mouth every 4 (four) hours as needed for moderate pain.     pantoprazole 40 MG tablet  Commonly known as:  PROTONIX  Take 40 mg by mouth daily.        Diagnostic Studies: No results found.  Disposition: Discharge to Home  Assessment/Plan: 1. He will be discharged to home today.  While at home weight-bear as  tolerated with crutches. 2. Wound culture showed growth of Staph Aureus.  Will begin patient on Keflex 500mg  QID for 1 month and Doxycycline 100mg  BID for 1 month 3. Will resume outpatient PT in 1-2 weeks 4. F/U with Dr. Roland Rack in office in one weeks.    Follow-up Information    Follow up with Corky Mull, MD In 1 week.   Specialty:  Surgery   Contact information:   [place] [id number] [phone]      Signed: Judson Roch PA-C 08/19/2014, 8:00 AM

## 2014-08-19 NOTE — Progress Notes (Signed)
Physical Therapy Treatment Patient Details Name: Kyle Foley MRN: [id number] DOB: March 11, 1996 Today's Date: 08/19/2014    History of Present Illness Patient is a 19 y/o male that presents s/p left fibular hardware removal secondary to infection. Patient fractured his left fibula and had hardware placed on 06/23/14 and had just begun outpatient PT prior to developing significant pain in his LLE.     PT Comments    Patient displays improved safety with mobility and ambulation today. He tolerates weight bearing standing exercises today such as hip marching and mini squats with hand hold assistance. Patient will continue to benefit from skilled PT services to address his weight bearing and mobility deficits. He continues to be hesitant to weight bear on his LLE, but will with encouragement.   Follow Up Recommendations  Outpatient PT     Equipment Recommendations       Recommendations for Other Services       Precautions / Restrictions Precautions Precautions: None Restrictions Weight Bearing Restrictions: Yes LLE Weight Bearing: Weight bearing as tolerated    Mobility  Bed Mobility Overal bed mobility: Independent                Transfers Overall transfer level: Modified independent Equipment used: Crutches                Ambulation/Gait Ambulation/Gait assistance: Modified independent (Device/Increase time) Ambulation Distance (Feet): 200 Feet Assistive device: Crutches       General Gait Details: Patient displayed heel strike consistently throughout gait and was able to transfer weight throughout his left foot.    Stairs   Stairs assistance: Supervision Stair Management: With crutches Number of Stairs: 4 General stair comments: Patient is much more steady going up stairs than down. He required cuing to have LLE and crutches come to step down first.   Wheelchair Mobility    Modified Rankin (Stroke Patients Only)       Balance Overall  balance assessment: Modified Independent                                  Cognition Arousal/Alertness: Awake/alert Behavior During Therapy: WFL for tasks assessed/performed Overall Cognitive Status: Within Functional Limits for tasks assessed                      Exercises Total Joint Exercises Straight Leg Raises: AROM;Both;20 reps Marching in Standing: AROM;10 reps Bridges: AROM;Both;20 reps Other Exercises Other Exercises: Standing mini squats with hand hold assist x 10    General Comments        Pertinent Vitals/Pain Pain Assessment:  (Patient states he is having less pain than last night) Pain Location: left ankle/foot when weight bearing  Pain Intervention(s): Limited activity within patient's tolerance;Monitored during session    Home Living                      Prior Function            PT Goals (current goals can now be found in the care plan section) Acute Rehab PT Goals PT Goal Formulation: With patient Time For Goal Achievement: 08/31/14 Potential to Achieve Goals: Good Progress towards PT goals: Progressing toward goals    Frequency  BID    PT Plan Current plan remains appropriate    Co-evaluation             End of Session Equipment Utilized  During Treatment: Gait belt Activity Tolerance: Patient tolerated treatment well Patient left: in bed;with call bell/phone within reach;with family/visitor present     Time: 4010-2725 PT Time Calculation (min) (ACUTE ONLY): 13 min  Charges:  $Gait Training: 8-22 mins                    G Codes:      Kerman Passey, PT, DPT   08/19/2014, 12:39 PM

## 2014-08-20 LAB — WOUND CULTURE

## 2014-08-22 ENCOUNTER — Encounter: Payer: 59 | Admitting: Physical Therapy

## 2014-08-24 ENCOUNTER — Encounter: Payer: 59 | Admitting: Physical Therapy

## 2014-08-29 ENCOUNTER — Encounter: Payer: Self-pay | Admitting: Physical Therapy

## 2014-08-29 ENCOUNTER — Ambulatory Visit: Payer: 59 | Admitting: Physical Therapy

## 2014-08-29 DIAGNOSIS — M6281 Muscle weakness (generalized): Secondary | ICD-10-CM | POA: Diagnosis present

## 2014-08-29 DIAGNOSIS — M25672 Stiffness of left ankle, not elsewhere classified: Secondary | ICD-10-CM | POA: Diagnosis present

## 2014-08-29 NOTE — Therapy (Signed)
[facility] [address] [place], [address] Phone: [phone]   Fax:  [phone]  Physical Therapy Treatment  Patient Details  Name: Kyle Foley MRN: [id number] Date of Birth: February 08, 1996 Referring Provider:  Corky Mull, MD  Encounter Date: 08/29/2014      PT End of Session - 08/29/14 1700    Visit Number 3   Number of Visits 12   Date for PT Re-Evaluation 09/22/14   PT Start Time 1618   PT Stop Time 1655   PT Time Calculation (min) 37 min   Activity Tolerance Patient tolerated treatment well   Behavior During Therapy [facility] for tasks assessed/performed      Past Medical History  Diagnosis Date  . Allergy     Past Surgical History  Procedure Laterality Date  . Left ankle orif Left 06/23/2014  . Tonsillectomy    . Fracture surgery    . Hardware removal Left 08/16/2014    Procedure: HARDWARE REMOVAL;  Surgeon: Corky Mull, MD;  Location: [facility] ORS;  Service: Orthopedics;  Laterality: Left;    There were no vitals filed for this visit.  Visit Diagnosis:  Ankle stiffness, left  Muscle weakness      Subjective Assessment - 08/29/14 1623    Subjective Patient reports he had a staph infeciton in his ankle and had hardware removed 08/18/14 and was released from hospital on 08/19/2014. He is able to walk WBAT left LE and his fracture is healed. He is still very tender to touch and wears cast boot just for extra support and protection.    Limitations Walking;Standing;House hold activities;Other (comment)   Currently in Pain? No/denies     Objective: Patient ambulated into clinic with cast boot on left LE, ace wrap around left ankle with reports of soreness to touch and that is why he is wearing the boot today       [place] Adult PT Treatment/Exercise - 08/29/14 1628    Exercises   Exercises Other Exercises   Other Exercises  today: ROM exercises in supine long sitting, isometric  exercises for DF x 10 each  Performed manual resistive intrinsic exercises left foot with toe flexion/ankle DF and toe extension with ankle PF x 10 reps each, 5 second holds  seated exercises: DF alternating feet x 20, PF alternating feet x 20and eversion/inversion x 20 reps, sit to stand on blue balance pad 2 x 10 reps off high treatment table with ball between knees with verbal cues for correct alignment and erect posture  AROM: 10 degrees right DF/ left 5 DF, PF right ankle: 20, left/45: right ankle: inversion 30/eversion20 : left 5 eversion/10 inversion: AROM following exercises: left ankle DF 10, PF 25                    Patient response to treatment: improved weight shift to left LE, improved transfers sit to stand with less difficulty and stiffness in left ankle, required verbal cuing and assistance to perform all exercises with correct technique and ROM       PT Education - 08/29/14 1700    Education provided Yes   Education Details Instructed in exercises for home/AROM left ankle and picking up marbles to reinforce intrinsic strengthening left foot   Person(s) Educated Patient   Methods Explanation;Demonstration;Verbal cues   Comprehension Verbalized understanding;Returned demonstration;Verbal cues required             PT Long Term  Goals - 08/10/14 1305    PT LONG TERM GOAL #1   Title Patient will exhibit normal gait pattern with mild deviations without brace on left LE ankle by 09/02/2014   Baseline Patient is ambulating with crutches and brace on left LE   Status New   PT LONG TERM GOAL #2   Title Patient will improve LEFS to 56/80 demonstrating improved LE function with decreased pain/difficulty by 09/09/2014   Baseline LEFS 47/80   Status New   PT LONG TERM GOAL #3   Title Patient will improve left ankle AROM to WNL's and close to right LE ankle ROM to improve abiltiy to ambulate without assistive device without deviations by 09/22/2014   Baseline AROM left ankle  DF (-3), PF 20, inversion 18, eversion 15   Status New   PT LONG TERM GOAL #4   Title Patient will be independent with home program for pain/swelling control, ROM and strengthening left LE without cuing to be able to transition to self management by 09/22/2014   Baseline Patient has limited knowledge of appriate progression of exercises to improve flexiblity and strength left LE/ankle   Status New               Plan - 08/29/14 1700    Clinical Impression Statement Patient is returning from short absence due to infection and subsequent hardware removal from left ankle. Patient demonstrated improved weight shifting, improved ROM and decreased stiffness in left ankle following treatment today. He continues with difficulty with walking, decreased strength and will require continued physical therapy intervention to achieve full return to function without difficulty.   Pt will benefit from skilled therapeutic intervention in order to improve on the following deficits Abnormal gait;Decreased strength;Difficulty walking;Decreased range of motion;Decreased scar mobility   Rehab Potential Good   PT Frequency 2x / week   PT Duration 6 weeks   PT Treatment/Interventions Gait training;Manual techniques;Therapeutic exercise;Scar mobilization;Moist Heat;Patient/family education;Ultrasound   PT Next Visit Plan Instruct in AROM, standing/walking exercises   PT Home Exercise Plan AROM exercises left ankle and foot intrinsics        Problem List Patient Active Problem List   Diagnosis Date Noted  . Wound, surgical, infected 08/15/2014    Jomarie Longs PT 08/29/2014, 6:50 PM  [facility] [address] [place], [address] Phone: [phone]   Fax:  [phone]

## 2014-09-01 ENCOUNTER — Encounter: Payer: Self-pay | Admitting: Physical Therapy

## 2014-09-01 ENCOUNTER — Ambulatory Visit: Payer: 59 | Admitting: Physical Therapy

## 2014-09-01 DIAGNOSIS — M6281 Muscle weakness (generalized): Secondary | ICD-10-CM

## 2014-09-01 DIAGNOSIS — M25672 Stiffness of left ankle, not elsewhere classified: Secondary | ICD-10-CM | POA: Diagnosis not present

## 2014-09-01 NOTE — Therapy (Signed)
[facility] [address], [place], [address] Phone: [phone]   Fax:  [phone]  Physical Therapy Treatment  Patient Details  Name: Kyle Foley MRN: [id number] Date of Birth: 07/09/1995 Referring Provider:  Corky Mull, MD  Encounter Date: 09/01/2014      PT End of Session - 09/01/14 1809    Visit Number 4   Number of Visits 12   Date for PT Re-Evaluation 09/22/14   PT Start Time 4403   PT Stop Time 1745   PT Time Calculation (min) 30 min   Activity Tolerance Patient tolerated treatment well   Behavior During Therapy [facility] for tasks assessed/performed      Past Medical History  Diagnosis Date  . Allergy     Past Surgical History  Procedure Laterality Date  . Left ankle orif Left 06/23/2014  . Tonsillectomy    . Fracture surgery    . Hardware removal Left 08/16/2014    Procedure: HARDWARE REMOVAL;  Surgeon: Corky Mull, MD;  Location: [facility] ORS;  Service: Orthopedics;  Laterality: Left;    There were no vitals filed for this visit.  Visit Diagnosis:  Ankle stiffness, left  Muscle weakness      Subjective Assessment - 09/01/14 1715    Subjective Patient reports he is walking at home without cast boot and his chief complaint is stiffness and not pain at the moment. He is going to [place] to have stitches taken out tomorrow.    Currently in Pain? No/denies      Objective: Gait: without boot: wearing tennis shoes: decreased weight shift to left LE and decreased ankle DF noted, decreased push off left LE AROM: left ankle pre treatment: DF 10, PF 25 degrees       [facility] Adult PT Treatment/Exercise - 09/01/14 1802    Exercises   Exercises Other Exercises   Other Exercises  today: ROM exercises in supine long sitting, isometric exercises for DF/PF and eversion x 10 reps each with 10 second holds, 10 right DF left 10 degrees DF, PF 25 left/45 :   after  Ex:  Left ankle DF 10, PF 35,  toe intrinsic exercises performed with manual reistance 10 reps x 5 second holds, balance/proprioception exercises performed in stand on balance pad with swaying forward and back x 2 min. and side to side for 2 min. then walked forward and backwards x 2 min. and then diagonal wedding march to engage hip abductors for improved gait pattern x 2 min., at stairs: hip and knee flexion with controlled motion and then side step up and down steps 2 sets with each LE leading with verbal cuing   Manual Therapy   Manual Therapy Soft tissue mobilization/joint mobilization   Manual therapy comments patient supine lying   Soft tissue mobilization STM left foot plantar aspect, joint mobilizaiton of left foot metatarsals and talocrural joint grade 2-3 anterior mobs. X 5 reps      Patient response to treatment: improved AROM with decreased stiffness in joint left ankle PF, improved gait pattern with push off and more narrow base of support at end of session, required verbal cuing to perform most exercises          PT Education - 09/01/14 1730    Education provided Yes   Education Details Instructed in proper gait pattern with improved push off and decreased base of support to more normal instead of keeping left LE out to side  during stance phase   Person(s) Educated Patient   Methods Explanation;Demonstration;Verbal cues   Comprehension Verbalized understanding;Returned demonstration;Verbal cues required             PT Long Term Goals - 08/10/14 1305    PT LONG TERM GOAL #1   Title Patient will exhibit normal gait pattern with mild deviations without brace on left LE ankle by 09/02/2014   Baseline Patient is ambulating with crutches and brace on left LE   Status New   PT LONG TERM GOAL #2   Title Patient will improve LEFS to 56/80 demonstrating improved LE function with decreased pain/difficulty by 09/09/2014   Baseline LEFS 47/80   Status New   PT LONG TERM GOAL #3   Title Patient will improve  left ankle AROM to WNL's and close to right LE ankle ROM to improve abiltiy to ambulate without assistive device without deviations by 09/22/2014   Baseline AROM left ankle DF (-3), PF 20, inversion 18, eversion 15   Status New   PT LONG TERM GOAL #4   Title Patient will be independent with home program for pain/swelling control, ROM and strengthening left LE without cuing to be able to transition to self management by 09/22/2014   Baseline Patient has limited knowledge of appriate progression of exercises to improve flexiblity and strength left LE/ankle   Status New               Plan - 09/01/14 1800    Clinical Impression Statement Patient demonstrated improved weight shift, improved ROM and decreased stiffness in left ankle following treatment. He continues to requrie guidance to walk with decresaed BOS and to improve ROM and astrength in order to return to prior level of funciton.    Pt will benefit from skilled therapeutic intervention in order to improve on the following deficits Abnormal gait;Decreased strength;Difficulty walking;Decreased range of motion;Decreased scar mobility   Rehab Potential Good   PT Frequency 2x / week   PT Duration 6 weeks   PT Treatment/Interventions Gait training;Manual techniques;Therapeutic exercise;Scar mobilization;Moist Heat;Patient/family education;Ultrasound   PT Next Visit Plan Instruct in AROM, standing/walking exercises   PT Home Exercise Plan AROM exercises left ankle and foot intrinsics        Problem List Patient Active Problem List   Diagnosis Date Noted  . Wound, surgical, infected 08/15/2014    Jomarie Longs PT 09/01/2014, 6:13 PM  [place] [address], [place], [address] Phone: [phone]   Fax:  [phone]

## 2014-09-05 ENCOUNTER — Encounter: Payer: 59 | Admitting: Physical Therapy

## 2014-09-06 ENCOUNTER — Ambulatory Visit: Payer: 59 | Admitting: Physical Therapy

## 2014-09-06 ENCOUNTER — Encounter: Payer: Self-pay | Admitting: Physical Therapy

## 2014-09-06 DIAGNOSIS — M25672 Stiffness of left ankle, not elsewhere classified: Secondary | ICD-10-CM | POA: Diagnosis not present

## 2014-09-06 DIAGNOSIS — M6281 Muscle weakness (generalized): Secondary | ICD-10-CM

## 2014-09-06 NOTE — Therapy (Signed)
[place] [facility] [address] [place], [address] Phone: [phone]   Fax:  [phone]  Physical Therapy Treatment  Patient Details  Name: Kyle Foley MRN: [id number] Date of Birth: 1995-10-24 Referring Provider:  Corky Mull, MD  Encounter Date: 09/06/2014      PT End of Session - 09/06/14 1739    Visit Number 5   Number of Visits 12   Date for PT Re-Evaluation 09/22/14   PT Start Time 6433   PT Stop Time 1816   PT Time Calculation (min) 38 min   Activity Tolerance Patient tolerated treatment well   Behavior During Therapy [facility] for tasks assessed/performed      Past Medical History  Diagnosis Date  . Allergy     Past Surgical History  Procedure Laterality Date  . Left ankle orif Left 06/23/2014  . Tonsillectomy    . Fracture surgery    . Hardware removal Left 08/16/2014    Procedure: HARDWARE REMOVAL;  Surgeon: Corky Mull, MD;  Location: [facility] ORS;  Service: Orthopedics;  Laterality: Left;    There were no vitals filed for this visit.  Visit Diagnosis:  Ankle stiffness, left  Muscle weakness      Subjective Assessment - 09/06/14 1738    Subjective Patient reports he is walking at home without cast boot and his chief complaint is stiffness and not pain at the moment.  He had stitches removed last week and is now wearing ace wrap and has steristrips in place over incision.    Limitations Standing;Walking   Patient Stated Goals to return to prior level of function and improve mobility in left ankle for walking and daily tasks   Currently in Pain? No/denies   Multiple Pain Sites No     AROM: left ankle: DF 10, PF 40 degrees, eversion/inversion WFL with stiffness at end ranges Gait: decreased heel/toe, push off and hip extension left LE prior to treatment      [facility] Adult PT Treatment/Exercise - 09/06/14 1740    Exercises   Exercises Other Exercises   Other Exercises   ROM  exercises in supine long sitting, isometric exercises for DF/PF and eversion x 10 reps each with 10 second holds,  left 10 degrees DF, PF 40 left, toe intrinsic exercises performed with manual reistance 10 reps x 5 second holds, balance/proprioception exercises performed walking on foam balance beam side stepping x 2 min., balance on wobble board x 2 min. With UE for support as needed,  stepping on balance stones 5 sets, at stairs calf raises and stretching performed 10 reps and 20 second stretch   Manual Therapy   Manual Therapy Soft tissue mobilization;Myofascial release   Manual therapy comments patient in supine lying,    Soft tissue mobilization STM posterior aspect of left ankle, calf muscle,    Myofascial Release releases to improve PF and DF soft tissue deep releases with patient supine lying     Patient response to treatment: patient able to improve gait pattern with increased hip extension, heel/toe push off with verbal cuing, improved balance/proprioception with exercises and repetition        PT Education - 09/06/14 1815    Education provided Yes   Education Details Reinforced home exercise program including walking forward and backwards and working of heel toe gait pattern   Person(s) Educated Patient   Methods Explanation;Demonstration;Verbal cues   Comprehension Verbalized understanding;Returned demonstration;Verbal cues required  PT Long Term Goals - 08/10/14 1305    PT LONG TERM GOAL #1   Title Patient will exhibit normal gait pattern with mild deviations without brace on left LE ankle by 09/02/2014   Baseline Patient is ambulating with crutches and brace on left LE   Status New   PT LONG TERM GOAL #2   Title Patient will improve LEFS to 56/80 demonstrating improved LE function with decreased pain/difficulty by 09/09/2014   Baseline LEFS 47/80   Status New   PT LONG TERM GOAL #3   Title Patient will improve left ankle AROM to WNL's and close to right LE  ankle ROM to improve abiltiy to ambulate without assistive device without deviations by 09/22/2014   Baseline AROM left ankle DF (-3), PF 20, inversion 18, eversion 15   Status New   PT LONG TERM GOAL #4   Title Patient will be independent with home program for pain/swelling control, ROM and strengthening left LE without cuing to be able to transition to self management by 09/22/2014   Baseline Patient has limited knowledge of appriate progression of exercises to improve flexiblity and strength left LE/ankle   Status New               Plan - 09/06/14 1820    Clinical Impression Statement Patient demonstrated improved ROM, decreased stiffness and improved gait pattern and balance following treatment. He conitnues with limitiations of ROM and strength in left LE and will therefore require additional physical therapy intervention to achieve goals.    Pt will benefit from skilled therapeutic intervention in order to improve on the following deficits Abnormal gait;Decreased strength;Difficulty walking;Decreased range of motion   Rehab Potential Good   PT Frequency 2x / week   PT Duration 6 weeks   PT Treatment/Interventions Ultrasound;Patient/family education;Gait training;Cryotherapy;Electrical Stimulation;Moist Heat;Therapeutic exercise;Manual techniques   PT Next Visit Plan manual techniques, strengtening and balance exercises to improve gait pattern        Problem List Patient Active Problem List   Diagnosis Date Noted  . Wound, surgical, infected 08/15/2014    Jomarie Longs PT 09/06/2014, 10:24 PM  [place] [address], [place], [address] Phone: [phone]   Fax:  [phone]

## 2014-09-08 ENCOUNTER — Ambulatory Visit: Payer: 59 | Admitting: Physical Therapy

## 2014-09-08 ENCOUNTER — Encounter: Payer: Self-pay | Admitting: Physical Therapy

## 2014-09-08 DIAGNOSIS — M25672 Stiffness of left ankle, not elsewhere classified: Secondary | ICD-10-CM | POA: Diagnosis not present

## 2014-09-08 DIAGNOSIS — M6281 Muscle weakness (generalized): Secondary | ICD-10-CM

## 2014-09-08 NOTE — Therapy (Signed)
[facility] [address], [place], [address] Phone: [phone]   Fax:  [phone]  Physical Therapy Treatment  Patient Details  Name: Kyle Foley MRN: [id number] Date of Birth: Apr 26, 1995 Referring Provider:  Corky Mull, MD  Encounter Date: 09/08/2014      PT End of Session - 09/08/14 1643    Visit Number 6   Number of Visits 12   Date for PT Re-Evaluation 09/22/14   PT Start Time 1537   PT Stop Time 1625   PT Time Calculation (min) 48 min   Activity Tolerance Patient tolerated treatment well   Behavior During Therapy [facility] for tasks assessed/performed      Past Medical History  Diagnosis Date  . Allergy     Past Surgical History  Procedure Laterality Date  . Left ankle orif Left 06/23/2014  . Tonsillectomy    . Fracture surgery    . Hardware removal Left 08/16/2014    Procedure: HARDWARE REMOVAL;  Surgeon: Corky Mull, MD;  Location: [facility] ORS;  Service: Orthopedics;  Laterality: Left;    There were no vitals filed for this visit.  Visit Diagnosis:  Ankle stiffness, left  Muscle weakness      Subjective Assessment - 09/08/14 1541    Subjective Patient reports he went without boot on left LE for the first time yesterday and went to driving range and did well with lace up ankle brace on left ankle.    Limitations Standing;Walking   Patient Stated Goals to return to prior level of function and improve mobility in left ankle for walking and daily tasks   Currently in Pain? No/denies  reports stiffness only   Pain Location Ankle   Pain Orientation Left   Multiple Pain Sites No     Objective: Gait: ambulating into clinic without boot on left LE, using lace up ankle brace with sneaker, decreased heel/toe pattern and increased BOS noted with increased toe out on left LE  AROM: left ankle DF 5 degrees, PF 30 degrees with mild swelling in left ankle and wearing ace wrap for support  to protect incision          [facility] Adult PT Treatment/Exercise - 09/08/14 1542    Exercises   Exercises Other Exercises   Other Exercises  leg press double leg x 65#, 75# and 45# single x 15 reps each, ball between knees for double leg press to keep good alignment of LE's, walking along balance beam side steppign x 2 min. with control, no UE support today, walk over balance stones x 10 reps with controlled motion without UE support, standing calf raises x 15 with 20 second stretch, standing single leg ball toss with one LE on BOSU ball, tossing 4# ball at pitchback 15 tosses each leg, NuStep x 5 min. level #4 workload for ankle ROM and leg press, supine lying isometric DF/PF with manual resistance and manual stretching into PF and DF, isometric intrinsic exercises for toe flexion/extension with DF/PF respectively x 10 reps with 5 second holds   Manual Therapy   Manual Therapy Soft tissue mobilization;Myofascial release;Joint mobilization   Manual therapy comments patient in supine lying,    Joint Mobilization talocrural distraction grade 2-3 x 5 reps, PA mobilization left ankle talocrural joint to improve PF, calcaneal molbilization lateral glides x 5 reps, AP glides metatarsal joints x 3 reps grade 2-3   Soft tissue mobilization STM posterior aspect of left ankle, calf  muscle,    Myofascial Release releases to improve PF and DF soft tissue deep releases with patient supine lying     Patient response to treatment: fatigued with exercises, improved flexibility in left ankle with mobilization of joints, soft tissue, improved gait pattern with more narrow base of support and continued with toe out left LE and decreased push off on left, he required verbal cuing and guidance with most exercises to maintain good alignment of LE during exercises and graded resistance for ankle exercises        PT Education - 09/08/14 1641    Education provided Yes   Education Details educated patient in  appropriate walking at beach on sand and modification of activity based on pain level felt with activity, none over 3/10 and if any discomfort or abnormal feeling in ankle, stop activity, add picking up marbles to home program   Person(s) Educated Patient   Methods Explanation   Comprehension Verbalized understanding             PT Long Term Goals - 08/10/14 1305    PT LONG TERM GOAL #1   Title Patient will exhibit normal gait pattern with mild deviations without brace on left LE ankle by 09/02/2014   Baseline Patient is ambulating with crutches and brace on left LE   Status New   PT LONG TERM GOAL #2   Title Patient will improve LEFS to 56/80 demonstrating improved LE function with decreased pain/difficulty by 09/09/2014   Baseline LEFS 47/80   Status New   PT LONG TERM GOAL #3   Title Patient will improve left ankle AROM to WNL's and close to right LE ankle ROM to improve abiltiy to ambulate without assistive device without deviations by 09/22/2014   Baseline AROM left ankle DF (-3), PF 20, inversion 18, eversion 15   Status New   PT LONG TERM GOAL #4   Title Patient will be independent with home program for pain/swelling control, ROM and strengthening left LE without cuing to be able to transition to self management by 09/22/2014   Baseline Patient has limited knowledge of appriate progression of exercises to improve flexiblity and strength left LE/ankle   Status New               Plan - 09/08/14 1644    Clinical Impression Statement Patient is progressing well with goals and is weaning out of boot and wearing lace up ankle support now. He demonstrates imrprovement with gait pattern with more normal base of support and improving heel/toe pattern with push off on left LE. He continues with weakness, decresaed ROM and strenght in left LE and will continue to benefit from additional physical therapy intervention to return to prior level of function.    Pt will benefit from  skilled therapeutic intervention in order to improve on the following deficits Abnormal gait;Decreased strength;Difficulty walking;Decreased range of motion   Rehab Potential Good   PT Frequency 2x / week   PT Duration 6 weeks   PT Treatment/Interventions Ultrasound;Patient/family education;Gait training;Cryotherapy;Electrical Stimulation;Moist Heat;Therapeutic exercise;Manual techniques   PT Next Visit Plan manual techniques, strengtening and balance exercises to improve gait pattern   PT Home Exercise Plan continue with home program as insructed and add pick up marbles        Problem List Patient Active Problem List   Diagnosis Date Noted  . Wound, surgical, infected 08/15/2014    Jomarie Longs PT 09/08/2014, 4:49 PM  [facility] [address] [place], [address] Phone: [phone]   Fax:  [phone]

## 2014-09-15 ENCOUNTER — Encounter: Payer: 59 | Admitting: Physical Therapy

## 2014-09-20 ENCOUNTER — Ambulatory Visit: Payer: 59 | Attending: Surgery | Admitting: Physical Therapy

## 2014-09-20 ENCOUNTER — Encounter: Payer: Self-pay | Admitting: Physical Therapy

## 2014-09-20 DIAGNOSIS — M25672 Stiffness of left ankle, not elsewhere classified: Secondary | ICD-10-CM | POA: Diagnosis present

## 2014-09-20 DIAGNOSIS — M6281 Muscle weakness (generalized): Secondary | ICD-10-CM | POA: Diagnosis present

## 2014-09-20 NOTE — Therapy (Signed)
[facility] [address] [place], [address] Phone: [phone]   Fax:  [phone]  Physical Therapy Treatment  Patient Details  Name: Kyle Foley MRN: [id number] Date of Birth: November 03, 1995 Referring Provider:  Corky Mull, MD  Encounter Date: 09/20/2014      PT End of Session - 09/20/14 1755    Visit Number 7   Number of Visits 12   Date for PT Re-Evaluation 09/22/14   PT Start Time 1701   PT Stop Time 1750   PT Time Calculation (min) 49 min   Activity Tolerance Patient tolerated treatment well   Behavior During Therapy [facility] for tasks assessed/performed      Past Medical History  Diagnosis Date  . Allergy     Past Surgical History  Procedure Laterality Date  . Left ankle orif Left 06/23/2014  . Tonsillectomy    . Fracture surgery    . Hardware removal Left 08/16/2014    Procedure: HARDWARE REMOVAL;  Surgeon: Corky Mull, MD;  Location: [facility] ORS;  Service: Orthopedics;  Laterality: Left;    There were no vitals filed for this visit.  Visit Diagnosis:  Ankle stiffness, left  Muscle weakness      Subjective Assessment - 09/20/14 1704    Subjective Patient reports he is doing well and is still wearing ankle brace and ace wrap for support and caution. He is exercising at home and is beginning to get out more and play golf 9 holes and rode in cart. He denies pain and has primary complaint of stiffness.    How long can you sit comfortably? no problem   How long can you stand comfortably? >1 hour   Patient Stated Goals to return to prior level of function and improve mobility in left ankle for walking and daily tasks   Currently in Pain? No/denies  only stiffness reported   Pain Location Ankle   Pain Orientation Left   Multiple Pain Sites No        Objective: AROM: left ankle DF 0-10, PF 0-35 Gait: ambulating independently without assistive device with ace wrap and support on  left ankle, mild gait deviation with decreased push off on left LE        [facility] Adult PT Treatment/Exercise - 09/20/14 1708    Exercises   Exercises Other Exercises   Other Exercises  leg press double leg x 75#  And 95# and 45# single x 15 reps each, ball between knees for double leg press to keep good alignment of LE's, instructed in walking against resistive band forward x 10, backwards x 10 and side stepping 5x each right/left, instructed in standing stabilization with yellow resistive band 4 way SLR each LE, with demonstration and verbal instruction, standing calf raises 3 x 10 with 20 second stretch, standing single leg ball toss with one LE on BOSU ball, tossing 4# ball at pitchback 15 tosses each leg, and both feet on BOSU ball while tossing ball x 15 reps, supine lying isometric DF/PF with manual resistance and manual stretching into PF and DF, isometric intrinsic exercises for toe flexion/extension with DF/PF respectively x 10 reps with 5 second holds   Manual Therapy   Manual Therapy Soft tissue mobilization;Myofascial release;Joint mobilization   Manual therapy comments patient in supine lying,    Joint Mobilization talocrural distraction grade 2-3 x 5 reps, PA mobilization left ankle talocrural joint to improve PF, metatarsal joints x 3 reps grade  2-3   Soft tissue mobilization STM posterior aspect of left ankle, calf muscle,    Myofascial Release releases to improve PF and DF soft tissue deep releases with patient supine lying      Patient response to treatment: improved plantar flexion left ankle to 40 degrees following soft tissue/MFR techniques, improved technique with exercises with verbal cuing and following demonstration, improved gait pattern with decreased limp and improved push off          PT Education - 09/20/14 1710    Education provided Yes   Education Details Reinforced home exercises including standing SLR x 4 way with resistive band, and resistive band walking  (new with written instructions given) Patient verbalized understanding, returned demonstration with verbal cuing             PT Long Term Goals - 08/10/14 1305    PT LONG TERM GOAL #1   Title Patient will exhibit normal gait pattern with mild deviations without brace on left LE ankle by 09/02/2014   Baseline Patient is ambulating with crutches and brace on left LE   Status Achieved   PT LONG TERM GOAL #2   Title Patient will improve LEFS to 56/80 demonstrating improved LE function with decreased pain/difficulty by 09/09/2014   Baseline LEFS 47/80   Status Ongoing   PT LONG TERM GOAL #3   Title Patient will improve left ankle AROM to WNL's and close to right LE ankle ROM to improve abiltiy to ambulate without assistive device without deviations by 09/22/2014   Baseline AROM left ankle DF (-3), PF 20, inversion 18, eversion 15   Status Achieved   PT LONG TERM GOAL #4   Title Patient will be independent with home program for pain/swelling control, ROM and strengthening left LE without cuing to be able to transition to self management by 09/22/2014   Baseline Patient has limited knowledge of appriate progression of exercises to improve flexiblity and strength left LE/ankle   Status Ongoing               Problem List Patient Active Problem List   Diagnosis Date Noted  . Wound, surgical, infected 08/15/2014    Jomarie Longs PT 09/20/2014, 6:20 PM  [place] [address], [place], [address] Phone: [phone]   Fax:  [phone]

## 2014-09-22 ENCOUNTER — Encounter: Payer: Self-pay | Admitting: Physical Therapy

## 2014-09-22 ENCOUNTER — Ambulatory Visit: Payer: 59 | Admitting: Physical Therapy

## 2014-09-22 ENCOUNTER — Encounter: Payer: 59 | Admitting: Physical Therapy

## 2014-09-22 DIAGNOSIS — M25672 Stiffness of left ankle, not elsewhere classified: Secondary | ICD-10-CM | POA: Diagnosis not present

## 2014-09-22 DIAGNOSIS — M6281 Muscle weakness (generalized): Secondary | ICD-10-CM

## 2014-09-22 NOTE — Therapy (Signed)
[place] [facility] [address] [place], [address] Phone: [phone]   Fax:  [phone]  Physical Therapy Treatment/Discharge Summary  Patient Details  Name: Kyle Foley MRN: [id number] Date of Birth: [birth year] Referring Provider:  Corky Mull, MD  Encounter Date: 09/22/2014   Patient attended 8 sessions of physical therapy for left ankle fracture. He has achieved all goals and agrees to discharge to home program Functional assessment: foot/ankle disability index: 16% self perceived impairment with daily tasks (primarily with outdoor activities in yard ie cutting grass because he has not done this yet)      PT End of Session - 09/22/14 1806    Visit Number 8   Number of Visits 12   Date for PT Re-Evaluation 09/22/14   PT Start Time 1713   PT Stop Time 1753   PT Time Calculation (min) 40 min   Activity Tolerance Patient tolerated treatment well   Behavior During Therapy [facility] for tasks assessed/performed      Past Medical History  Diagnosis Date  . Allergy     Past Surgical History  Procedure Laterality Date  . Left ankle orif Left 06/23/2014  . Tonsillectomy    . Fracture surgery    . Hardware removal Left 08/16/2014    Procedure: HARDWARE REMOVAL;  Surgeon: Corky Mull, MD;  Location: [facility] ORS;  Service: Orthopedics;  Laterality: Left;    There were no vitals filed for this visit.  Visit Diagnosis:  Ankle stiffness, left  Muscle weakness      Subjective Assessment - 09/22/14 1758    Subjective Patient reports he is doing well and is still wearing ankle brace and ace wrap for support and caution. He is able to exercise without exacerbation of pain and symptoms and agrees to discharge to independent self management and home program.    How long can you sit comfortably? no problem   How long can you stand comfortably? >1 hour   Patient Stated Goals to return to prior level of function  and improve mobility in left ankle for walking and daily tasks   Currently in Pain? No/denies  intermittent stiffness in ankle only   Pain Location Ankle   Pain Orientation Left   Multiple Pain Sites No      Objective: AROM: left ankle DF 0-10 degrees, PF 0-40 degrees, mild limitations into eversion/inversion Strength: improving strength in left foot and ankle all planes and with stability in ankle as noted with stabilization exercises/single leg standing exercises       [facility] Adult PT Treatment/Exercise - 09/22/14 1758    Exercises   Exercises Other Exercises   Other Exercises  Reassessed home program for ankle resistive band exercises, standing stabilization re assessed with yellow resisitive band 4 way SLR x 10 reps each direction and walk against resistive band with double resistance 10 reps forward, 10 reps backwards and 5 reps side stepping each, to right and left, standing dynamic balance exercise with ball toss (stand with both feet on BOSU ball) x 15 tosses   Manual Therapy   Manual Therapy Soft tissue mobilization;Myofascial release;Joint mobilization   Manual therapy comments patient in supine lying,    Soft tissue mobilization STM performed with superficial techniques and deep releases to improve DF and PF with less pressure in ankle joint dorsal aspect    Myofascial Release releases to improve PF and DF soft tissue deep releases with patient supine lying  Patient response to treatment: improved soft tissue elasticity and mobility with treatment, performed all exercises with supervision only with good technique and stability           PT Education - 09/22/14 1800    Education provided Yes   Education Details Reinforced and re assessed exercises for home including walking against resistive band and standing stabilization with resistive band   Person(s) Educated Patient   Methods Explanation   Comprehension Verbalized understanding;Returned demonstration              PT Long Term Goals - 09/22/14 1810    PT LONG TERM GOAL #1   Title Patient will exhibit normal gait pattern with mild deviations without brace on left LE ankle by 09/02/2014   Baseline Patient is ambulating with crutches and brace on left LE   Status Achieved   PT LONG TERM GOAL #2   Title Patient will improve LEFS to 56/80 demonstrating improved LE function with decreased pain/difficulty by 09/09/2014   Baseline LEFS 47/80   Status Achieved   PT LONG TERM GOAL #3   Title Patient will improve left ankle AROM to WNL's and close to right LE ankle ROM to improve abiltiy to ambulate without assistive device without deviations by 09/22/2014   Baseline AROM left ankle DF (-3), PF 20, inversion 18, eversion 15   Status Achieved   PT LONG TERM GOAL #4   Title Patient will be independent with home program for pain/swelling control, ROM and strengthening left LE without cuing to be able to transition to self management by 09/22/2014   Baseline Patient has limited knowledge of appriate progression of exercises to improve flexiblity and strength left LE/ankle   Status Achieved               Plan - 09/22/14 1807    Clinical Impression Statement Patient has achieved goals for physical therapy including independent with home program and gait pattern WNL's. He continues with stiffness in left ankle and this should subside over time as his ankle continues to heal and he continues with self management .    Rehab Potential Good   PT Frequency 2x / week   PT Duration 6 weeks   PT Treatment/Interventions Patient/family education;Gait training;Cryotherapy;Electrical Stimulation;Moist Heat;Therapeutic exercise;Manual techniques   PT Next Visit Plan Discharge from physical therapy at this time   PT Home Exercise Plan continue with home program as instructed        Problem List Patient Active Problem List   Diagnosis Date Noted  . Wound, surgical, infected 08/15/2014    Jomarie Longs PT 09/22/2014, 6:12 PM  [place] [facility] [address], [place], [address] Phone: [phone]   Fax:  [phone]

## 2014-09-26 ENCOUNTER — Encounter: Payer: 59 | Admitting: Physical Therapy

## 2014-09-27 ENCOUNTER — Encounter: Payer: 59 | Admitting: Physical Therapy

## 2014-09-29 ENCOUNTER — Encounter: Payer: 59 | Admitting: Physical Therapy

## 2014-09-30 LAB — ACID FAST SMEAR+CULTURE W/RFLX (ARMC ONLY)
Acid Fast Culture: NEGATIVE
Acid Fast Smear: NEGATIVE

## 2015-03-15 DIAGNOSIS — B078 Other viral warts: Secondary | ICD-10-CM | POA: Diagnosis not present

## 2015-03-15 DIAGNOSIS — R208 Other disturbances of skin sensation: Secondary | ICD-10-CM | POA: Diagnosis not present

## 2015-07-17 DIAGNOSIS — J018 Other acute sinusitis: Secondary | ICD-10-CM | POA: Diagnosis not present

## 2015-07-17 DIAGNOSIS — J069 Acute upper respiratory infection, unspecified: Secondary | ICD-10-CM | POA: Diagnosis not present

## 2015-08-19 ENCOUNTER — Telehealth: Payer: 59 | Admitting: Nurse Practitioner

## 2015-08-19 DIAGNOSIS — L237 Allergic contact dermatitis due to plants, except food: Secondary | ICD-10-CM | POA: Diagnosis not present

## 2015-08-19 MED ORDER — PREDNISONE 10 MG (21) PO TBPK: 10.0000 mg | ORAL_TABLET | Freq: Every day | ORAL | Status: DC

## 2015-08-19 NOTE — Progress Notes (Signed)

## 2015-10-10 DIAGNOSIS — Z114 Encounter for screening for human immunodeficiency virus [HIV]: Secondary | ICD-10-CM | POA: Diagnosis not present

## 2015-10-10 DIAGNOSIS — B078 Other viral warts: Secondary | ICD-10-CM | POA: Diagnosis not present

## 2015-10-10 DIAGNOSIS — Z791 Long term (current) use of non-steroidal anti-inflammatories (NSAID): Secondary | ICD-10-CM | POA: Diagnosis not present

## 2015-10-10 DIAGNOSIS — Z0000 Encounter for general adult medical examination without abnormal findings: Secondary | ICD-10-CM | POA: Diagnosis not present

## 2015-10-10 DIAGNOSIS — L821 Other seborrheic keratosis: Secondary | ICD-10-CM | POA: Diagnosis not present

## 2015-10-12 DIAGNOSIS — Z0100 Encounter for examination of eyes and vision without abnormal findings: Secondary | ICD-10-CM | POA: Diagnosis not present

## 2015-10-17 DIAGNOSIS — Z0000 Encounter for general adult medical examination without abnormal findings: Secondary | ICD-10-CM | POA: Diagnosis not present

## 2015-10-17 DIAGNOSIS — J3089 Other allergic rhinitis: Secondary | ICD-10-CM | POA: Diagnosis not present

## 2015-10-17 DIAGNOSIS — K219 Gastro-esophageal reflux disease without esophagitis: Secondary | ICD-10-CM | POA: Diagnosis not present

## 2016-08-16 ENCOUNTER — Telehealth: Payer: 59 | Admitting: Nurse Practitioner

## 2016-08-16 DIAGNOSIS — L559 Sunburn, unspecified: Secondary | ICD-10-CM

## 2016-08-16 NOTE — Progress Notes (Signed)
We are sorry that you are not feeling well.  Here is how we plan to help!  Based on what you have shared with me, I'd like to share with you a treatment plan for sunburn.   Most sunburn is a first degree burn that turns the skin pink or red.  It can be painful to touch.  If you stayed in the sun for a prolonged period this might have progressed to a second degree burn with blistering!  Usually the pain and swelling starts after about 4 hours, peaks at 24 hours and begins to improve after 48 hours or about 2 days.  REMEMBER prolonged exposure to the sun increases your risk of skin cancer so use sunscreen before you go outside!  We will give you more information about sunscreen use later in your care plan.  Your sunburn can be managed by self-care at home.  Please use the following care guide to manage your sunburn.  If you symptoms worsen, you have other questions or concerns, or you develop any of the warnings signs listed in your care plan you will need to seek a face to face visit with a provider without waiting!  Home Care Advice for Treating Mild Sunburn:  1. Take Ibuprofen (Advil, Motrin) for pain relief as soon as possible.  The adult dosage is up to 600 mg every 6 hours.  Starting within 6 hours of sun exposure may greatly reduce your discomfort.  If you cannot take Ibuprofen you may use Acetaminophen instead.  Do not take Ibuprofen if you have stomach problems, kidney disease or are pregnant.  Do not take Ibuprofen if you have been told by your doctor or pharmacist to avoid this class of drugs.  Do not take Acetaminophen if you have liver disease.  Read the package warnings on any medication that you take!  2.  Use a steroid cream on the affected skin.  If you apply an over the counter steroid       cream as soon as possible and repeat it three times a day it may reduce the pain and      and swelling.  Until you get the steroid cream you may start with a moistening cream      cream  or aloe gel.  3.  Apply cool compresses to the burned areas several times a day.  4.  Avoid soap on the sunburned areas.  5.  Drink plenty of water.  It is easy to get dehydrated from prolong time in the sun      Outdoors. 6.  For any broken blisters:  Trim off the dead skin with fine scissors.  It is wise to clean the scissor with alcohol before use.  Apply antibiotic ointments to the blister.  Apply twice a day for three days.  There are triple antibiotic ointments with topical pain relievers available at stores.  Caution:leave intact blisters alone.  They are protecting the skin and will allow it to heal. 7.  Taking Vitamin C orally may reduce sun damage to your skin.  Follow the      Instructions on the bottle.  The recommended adult dosage is 2 grams.  What to Expect:  1. Pain usually stops after 2 or 3 days. 2. Skin flaking and peeling usually occurs for 3-7 days after a sunburn.  Call your provider if:  1. You feel very weak or have difficulty standing. 2. Blister develops on your face. 3. You become sensitive to light  because of eye pain. 4. Your skin looks infected (red streaks, puss or worsening tenderness after 48 hours. 5. You feel you should be seen.  Preventing Sunburns:  1. Apply 20-30 SPF sunscreen to your skin before going into the sun. 2. Reapply every 2-4 hours or after sweating or swimming. 3. Sunscreens protect from sunburns but do not completely prevent skin damage.  Nancy Fetter exposure still increases your risk of premature aging and skin cancers.  Your e-visit answers were reviewed by a board certified advanced clinical practitioner to complete your personal care plan.  Depending on the condition, your plan could have included both over the counter or prescription medications.  If there is a problem please reply  once you have received a response from your provider.  Your safety is important to [place].  If you have drug allergies check your prescription  carefully.    You can use MyChart to ask questions about today's visit, request a non-urgent call back, or ask for a work or school excuse for 24 hours related to this e-Visit. If it has been greater than 24 hours you will need to follow up with your provider, or enter a new e-Visit to address those concerns.  You will get an e-mail in the next two days asking about your experience.  I hope that your e-visit has been valuable and will speed your recovery. Thank you for using e-visits.

## 2016-10-11 DIAGNOSIS — K219 Gastro-esophageal reflux disease without esophagitis: Secondary | ICD-10-CM | POA: Diagnosis not present

## 2016-10-11 DIAGNOSIS — Z0000 Encounter for general adult medical examination without abnormal findings: Secondary | ICD-10-CM | POA: Diagnosis not present

## 2016-10-18 DIAGNOSIS — T7840XS Allergy, unspecified, sequela: Secondary | ICD-10-CM | POA: Diagnosis not present

## 2016-10-18 DIAGNOSIS — Z0000 Encounter for general adult medical examination without abnormal findings: Secondary | ICD-10-CM | POA: Diagnosis not present

## 2016-10-18 DIAGNOSIS — K219 Gastro-esophageal reflux disease without esophagitis: Secondary | ICD-10-CM | POA: Diagnosis not present

## 2017-11-04 DIAGNOSIS — Z0000 Encounter for general adult medical examination without abnormal findings: Secondary | ICD-10-CM | POA: Diagnosis not present

## 2017-11-04 DIAGNOSIS — K219 Gastro-esophageal reflux disease without esophagitis: Secondary | ICD-10-CM | POA: Diagnosis not present

## 2017-11-11 DIAGNOSIS — Z23 Encounter for immunization: Secondary | ICD-10-CM | POA: Diagnosis not present

## 2017-11-11 DIAGNOSIS — Z0000 Encounter for general adult medical examination without abnormal findings: Secondary | ICD-10-CM | POA: Diagnosis not present

## 2017-11-11 DIAGNOSIS — K219 Gastro-esophageal reflux disease without esophagitis: Secondary | ICD-10-CM | POA: Diagnosis not present

## 2017-11-11 DIAGNOSIS — S81812A Laceration without foreign body, left lower leg, initial encounter: Secondary | ICD-10-CM | POA: Diagnosis not present

## 2017-11-11 DIAGNOSIS — S81811A Laceration without foreign body, right lower leg, initial encounter: Secondary | ICD-10-CM | POA: Diagnosis not present

## 2018-02-23 DIAGNOSIS — H5213 Myopia, bilateral: Secondary | ICD-10-CM | POA: Diagnosis not present

## 2018-11-06 DIAGNOSIS — Z0000 Encounter for general adult medical examination without abnormal findings: Secondary | ICD-10-CM | POA: Diagnosis not present

## 2018-11-13 DIAGNOSIS — K219 Gastro-esophageal reflux disease without esophagitis: Secondary | ICD-10-CM | POA: Diagnosis not present

## 2018-11-13 DIAGNOSIS — Z0000 Encounter for general adult medical examination without abnormal findings: Secondary | ICD-10-CM | POA: Diagnosis not present

## 2019-01-24 DIAGNOSIS — Z20828 Contact with and (suspected) exposure to other viral communicable diseases: Secondary | ICD-10-CM | POA: Diagnosis not present

## 2019-08-18 ENCOUNTER — Other Ambulatory Visit: Payer: Self-pay | Admitting: Internal Medicine

## 2019-10-22 ENCOUNTER — Other Ambulatory Visit: Payer: Self-pay | Admitting: Internal Medicine

## 2019-12-17 DIAGNOSIS — Z1389 Encounter for screening for other disorder: Secondary | ICD-10-CM | POA: Diagnosis not present

## 2019-12-17 DIAGNOSIS — Z0000 Encounter for general adult medical examination without abnormal findings: Secondary | ICD-10-CM | POA: Diagnosis not present

## 2019-12-17 DIAGNOSIS — Z13220 Encounter for screening for lipoid disorders: Secondary | ICD-10-CM | POA: Diagnosis not present

## 2019-12-17 DIAGNOSIS — K219 Gastro-esophageal reflux disease without esophagitis: Secondary | ICD-10-CM | POA: Diagnosis not present

## 2019-12-24 DIAGNOSIS — Z0000 Encounter for general adult medical examination without abnormal findings: Secondary | ICD-10-CM | POA: Diagnosis not present

## 2019-12-24 DIAGNOSIS — Z1159 Encounter for screening for other viral diseases: Secondary | ICD-10-CM | POA: Diagnosis not present

## 2019-12-24 DIAGNOSIS — K219 Gastro-esophageal reflux disease without esophagitis: Secondary | ICD-10-CM | POA: Diagnosis not present

## 2020-04-04 DIAGNOSIS — U071 COVID-19: Secondary | ICD-10-CM | POA: Diagnosis not present

## 2020-04-04 DIAGNOSIS — J069 Acute upper respiratory infection, unspecified: Secondary | ICD-10-CM | POA: Diagnosis not present

## 2020-06-13 MED FILL — Fexofenadine HCl Tab 180 MG: ORAL | 90 days supply | Qty: 90 | Fill #0 | Status: AC

## 2020-06-14 ENCOUNTER — Other Ambulatory Visit: Payer: Self-pay

## 2020-06-15 ENCOUNTER — Other Ambulatory Visit: Payer: Self-pay

## 2020-07-17 ENCOUNTER — Other Ambulatory Visit: Payer: Self-pay

## 2020-07-17 ENCOUNTER — Ambulatory Visit: Payer: 59 | Admitting: Dermatology

## 2020-07-17 DIAGNOSIS — D485 Neoplasm of uncertain behavior of skin: Secondary | ICD-10-CM | POA: Diagnosis not present

## 2020-07-17 DIAGNOSIS — Z1283 Encounter for screening for malignant neoplasm of skin: Secondary | ICD-10-CM | POA: Diagnosis not present

## 2020-07-17 DIAGNOSIS — D229 Melanocytic nevi, unspecified: Secondary | ICD-10-CM | POA: Diagnosis not present

## 2020-07-17 DIAGNOSIS — D224 Melanocytic nevi of scalp and neck: Secondary | ICD-10-CM | POA: Diagnosis not present

## 2020-07-17 NOTE — Progress Notes (Signed)
   Follow-Up Visit   Subjective  Kyle Foley is a 25 y.o. male who presents for the following: Nevus (Patient would like nevi from waist up checked. He is not concerned about anything from the waist down. He does not have a hx of abnormal moles. ).  The following portions of the chart were reviewed this encounter and updated as appropriate:   Allergies  Meds  Problems  Med Hx  Surg Hx  Fam Hx     Review of Systems:  No other skin or systemic complaints except as noted in HPI or Assessment and Plan.  Objective  Well appearing patient in no apparent distress; mood and affect are within normal limits.  All skin waist up examined.  Objective  Right Antecubital: 0.6 x 0.4cm regular flat brown papule  left of midline inferior scalp: 0.7cm tan flat papule  Images      Objective  Left posterior base of neck: 1.0 x 0.6cm verrucous tan flat papule      Assessment & Plan  Nevus (2) left of midline inferior scalp; Right Antecubital Benign-appearing.  Observation.  Call clinic for new or changing lesions.  Recommend daily use of broad spectrum spf 30+ sunscreen to sun-exposed areas.   Neoplasm of uncertain behavior of skin Left posterior base of neck SK vs Epidermal Nevus Benign-appearing.  Observation.  Call clinic for new or changing lesions.  Recommend daily use of broad spectrum spf 30+ sunscreen to sun-exposed areas.   Skin cancer screening  Melanocytic Nevi - Tan-brown and/or pink-flesh-colored symmetric macules and papules - Benign appearing on exam today - Observation - Call clinic for new or changing moles - Recommend daily use of broad spectrum spf 30+ sunscreen to sun-exposed areas.   Return in about 1 year (around 07/17/2021) for UBSE.  Graciella Belton, RMA, am acting as scribe for Sarina Ser, MD . Documentation: I have reviewed the above documentation for accuracy and completeness, and I agree with the above.  Sarina Ser,  MD

## 2020-07-17 NOTE — Patient Instructions (Addendum)

## 2020-07-18 ENCOUNTER — Encounter: Payer: Self-pay | Admitting: Dermatology

## 2020-08-07 MED FILL — Pantoprazole Sodium EC Tab 40 MG (Base Equiv): ORAL | 90 days supply | Qty: 90 | Fill #0 | Status: AC

## 2020-08-08 ENCOUNTER — Other Ambulatory Visit: Payer: Self-pay

## 2020-09-03 ENCOUNTER — Encounter: Payer: Self-pay | Admitting: Physician Assistant

## 2020-09-03 ENCOUNTER — Telehealth: Payer: 59 | Admitting: Physician Assistant

## 2020-09-03 DIAGNOSIS — J208 Acute bronchitis due to other specified organisms: Secondary | ICD-10-CM | POA: Diagnosis not present

## 2020-09-03 DIAGNOSIS — J018 Other acute sinusitis: Secondary | ICD-10-CM | POA: Diagnosis not present

## 2020-09-03 MED ORDER — AMOXICILLIN-POT CLAVULANATE 875-125 MG PO TABS: 1.0000 | ORAL_TABLET | Freq: Two times a day (BID) | ORAL | 0 refills | Status: DC

## 2020-09-03 MED ORDER — PREDNISONE 10 MG PO TABS: ORAL_TABLET | ORAL | 0 refills | Status: DC

## 2020-09-03 MED ORDER — BENZONATATE 100 MG PO CAPS: 100.0000 mg | ORAL_CAPSULE | Freq: Two times a day (BID) | ORAL | 0 refills | Status: DC | PRN

## 2020-09-03 NOTE — Progress Notes (Signed)
We are sorry that you are not feeling well.  Here is how we plan to help!  Based on your presentation I believe you most likely have    In addition you may use A prescription cough medication called Tessalon Perles 100mg . You may take 1-2 capsules every 8 hours as needed for your cough.  Prednisone 10 mg daily for 6 days (see taper instructions below)  Directions for 6 day taper: Day 1: 2 tablets before breakfast, 1 after both lunch & dinner and 2 at bedtime Day 2: 1 tab before breakfast, 1 after both lunch & dinner and 2 at bedtime Day 3: 1 tab at each meal & 1 at bedtime Day 4: 1 tab at breakfast, 1 at lunch, 1 at bedtime Day 5: 1 tab at breakfast & 1 tab at bedtime Day 6: 1 tab at breakfast  From your responses in the eVisit questionnaire you describe inflammation in the upper respiratory tract which is causing a significant cough.  This is commonly called Bronchitis and has four common causes:   Allergies Viral Infections Acid Reflux Bacterial Infection Allergies, viruses and acid reflux are treated by controlling symptoms or eliminating the cause. An example might be a cough caused by taking certain blood pressure medications. You stop the cough by changing the medication. Another example might be a cough caused by acid reflux. Controlling the reflux helps control the cough.  USE OF BRONCHODILATOR ("RESCUE") INHALERS: There is a risk from using your bronchodilator too frequently.  The risk is that over-reliance on a medication which only relaxes the muscles surrounding the breathing tubes can reduce the effectiveness of medications prescribed to reduce swelling and congestion of the tubes themselves.  Although you feel brief relief from the bronchodilator inhaler, your asthma may actually be worsening with the tubes becoming more swollen and filled with mucus.  This can delay other crucial treatments, such as oral steroid medications. If you need to use a bronchodilator inhaler daily,  several times per day, you should discuss this with your provider.  There are probably better treatments that could be used to keep your asthma under control.     E-Visit for Sinus Problems  We are sorry that you are not feeling well.  Here is how we plan to help!  Based on what you have shared with me it looks like you have sinusitis.  Sinusitis is inflammation and infection in the sinus cavities of the head.  Based on your presentation I believe you most likely have Acute Bacterial Sinusitis.  This is an infection caused by bacteria and is treated with antibiotics. I have prescribed Augmentin 875mg /125mg  one tablet twice daily with food, for 7 days. You may use an oral decongestant such as Mucinex D or if you have glaucoma or high blood pressure use plain Mucinex. Saline nasal spray help and can safely be used as often as needed for congestion.  If you develop worsening sinus pain, fever or notice severe headache and vision changes, or if symptoms are not better after completion of antibiotic, please schedule an appointment with a health care provider.    Sinus infections are not as easily transmitted as other respiratory infection, however we still recommend that you avoid close contact with loved ones, especially the very young and elderly.  Remember to wash your hands thoroughly throughout the day as this is the number one way to prevent the spread of infection!  Home Care: Only take medications as instructed by your medical team. Complete  the entire course of an antibiotic. Do not take these medications with alcohol. A steam or ultrasonic humidifier can help congestion.  You can place a towel over your head and breathe in the steam from hot water coming from a faucet. Avoid close contacts especially the very young and the elderly. Cover your mouth when you cough or sneeze. Always remember to wash your hands.  Get Help Right Away If: You develop worsening fever or sinus pain. You develop  a severe head ache or visual changes. Your symptoms persist after you have completed your treatment plan.  Make sure you Understand these instructions. Will watch your condition. Will get help right away if you are not doing well or get worse.  Thank you for choosing an e-visit.  Your e-visit answers were reviewed by a board certified advanced clinical practitioner to complete your personal care plan. Depending upon the condition, your plan could have included both over the counter or prescription medications.  Please review your pharmacy choice. Make sure the pharmacy is open so you can pick up prescription now. If there is a problem, you may contact your provider through [employer] and have the prescription routed to another pharmacy.  Your safety is important to [place]. If you have drug allergies check your prescription carefully.   For the next 24 hours you can use MyChart to ask questions about today's visit, request a non-urgent call back, or ask for a work or school excuse. You will get an email in the next two days asking about your experience. I hope that your e-visit has been valuable and will speed your recovery.     HOME CARE Only take medications as instructed by your medical team. Complete the entire course of an antibiotic. Drink plenty of fluids and get plenty of rest. Avoid close contacts especially the very young and the elderly Cover your mouth if you cough or cough into your sleeve. Always remember to wash your hands A steam or ultrasonic humidifier can help congestion.   GET HELP RIGHT AWAY IF: You develop worsening fever. You become short of breath You cough up blood. Your symptoms persist after you have completed your treatment plan MAKE SURE YOU  Understand these instructions. Will watch your condition. Will get help right away if you are not doing well or get worse.    Thank you for choosing an e-visit.  Your e-visit answers were reviewed by a board  certified advanced clinical practitioner to complete your personal care plan. Depending upon the condition, your plan could have included both over the counter or prescription medications.  Please review your pharmacy choice. Make sure the pharmacy is open so you can pick up prescription now. If there is a problem, you may contact your provider through [employer] and have the prescription routed to another pharmacy.  Your safety is important to [place]. If you have drug allergies check your prescription carefully.   For the next 24 hours you can use MyChart to ask questions about today's visit, request a non-urgent call back, or ask for a work or school excuse. You will get an email in the next two days asking about your experience. I hope that your e-visit has been valuable and will speed your recovery. I spent 5-10 minutes on review and completion of this note- Lacy [facility] f

## 2020-09-06 MED FILL — Fexofenadine HCl Tab 180 MG: ORAL | 90 days supply | Qty: 90 | Fill #1 | Status: CN

## 2020-09-07 ENCOUNTER — Other Ambulatory Visit: Payer: Self-pay

## 2020-09-07 MED ORDER — FEXOFENADINE HCL 180 MG PO TABS
180.0000 mg | ORAL_TABLET | Freq: Every day | ORAL | 1 refills | Status: AC
  Filled 2020-09-07 – 2020-12-20 (×2): qty 100, 100d supply, fill #0
  Filled 2021-03-26: qty 100, 100d supply, fill #1

## 2020-09-12 ENCOUNTER — Other Ambulatory Visit: Payer: Self-pay

## 2020-11-05 ENCOUNTER — Other Ambulatory Visit: Payer: Self-pay

## 2020-11-06 ENCOUNTER — Other Ambulatory Visit: Payer: Self-pay

## 2020-11-07 ENCOUNTER — Other Ambulatory Visit: Payer: Self-pay

## 2020-11-07 MED ORDER — FLUTICASONE PROPIONATE 50 MCG/ACT NA SUSP
NASAL | 3 refills | Status: DC
  Filled 2020-11-07: qty 48, 90d supply, fill #0
  Filled 2021-05-17: qty 48, 90d supply, fill #1

## 2020-11-07 MED ORDER — PANTOPRAZOLE SODIUM 40 MG PO TBEC
40.0000 mg | DELAYED_RELEASE_TABLET | Freq: Every day | ORAL | 3 refills | Status: DC
  Filled 2020-11-07: qty 90, 90d supply, fill #0

## 2020-11-07 MED ORDER — PANTOPRAZOLE SODIUM 40 MG PO TBEC
DELAYED_RELEASE_TABLET | ORAL | 3 refills | Status: DC
  Filled 2020-11-07: qty 90, 90d supply, fill #0
  Filled 2021-02-05: qty 90, 90d supply, fill #1
  Filled 2021-05-06: qty 90, 90d supply, fill #2
  Filled 2021-08-07: qty 90, 90d supply, fill #3

## 2020-11-07 MED ORDER — FLUTICASONE PROPIONATE 50 MCG/ACT NA SUSP
NASAL | 3 refills | Status: AC
  Filled 2020-11-07: qty 48, 90d supply, fill #0

## 2020-12-18 DIAGNOSIS — Z13220 Encounter for screening for lipoid disorders: Secondary | ICD-10-CM | POA: Diagnosis not present

## 2020-12-18 DIAGNOSIS — Z0000 Encounter for general adult medical examination without abnormal findings: Secondary | ICD-10-CM | POA: Diagnosis not present

## 2020-12-18 DIAGNOSIS — K219 Gastro-esophageal reflux disease without esophagitis: Secondary | ICD-10-CM | POA: Diagnosis not present

## 2020-12-18 DIAGNOSIS — Z1159 Encounter for screening for other viral diseases: Secondary | ICD-10-CM | POA: Diagnosis not present

## 2020-12-21 ENCOUNTER — Other Ambulatory Visit: Payer: Self-pay

## 2021-01-02 DIAGNOSIS — Z0000 Encounter for general adult medical examination without abnormal findings: Secondary | ICD-10-CM | POA: Diagnosis not present

## 2021-01-02 DIAGNOSIS — K219 Gastro-esophageal reflux disease without esophagitis: Secondary | ICD-10-CM | POA: Diagnosis not present

## 2021-02-05 ENCOUNTER — Other Ambulatory Visit: Payer: Self-pay

## 2021-03-27 ENCOUNTER — Other Ambulatory Visit: Payer: Self-pay

## 2021-05-07 ENCOUNTER — Other Ambulatory Visit: Payer: Self-pay

## 2021-05-10 ENCOUNTER — Other Ambulatory Visit: Payer: Self-pay

## 2021-05-18 ENCOUNTER — Other Ambulatory Visit: Payer: Self-pay

## 2021-07-18 ENCOUNTER — Ambulatory Visit (INDEPENDENT_AMBULATORY_CARE_PROVIDER_SITE_OTHER): Payer: 59 | Admitting: Dermatology

## 2021-07-18 DIAGNOSIS — Z1283 Encounter for screening for malignant neoplasm of skin: Secondary | ICD-10-CM | POA: Diagnosis not present

## 2021-07-18 DIAGNOSIS — L578 Other skin changes due to chronic exposure to nonionizing radiation: Secondary | ICD-10-CM | POA: Diagnosis not present

## 2021-07-18 DIAGNOSIS — B353 Tinea pedis: Secondary | ICD-10-CM | POA: Diagnosis not present

## 2021-07-18 DIAGNOSIS — L814 Other melanin hyperpigmentation: Secondary | ICD-10-CM

## 2021-07-18 DIAGNOSIS — D2261 Melanocytic nevi of right upper limb, including shoulder: Secondary | ICD-10-CM | POA: Diagnosis not present

## 2021-07-18 DIAGNOSIS — D1800 Hemangioma unspecified site: Secondary | ICD-10-CM | POA: Diagnosis not present

## 2021-07-18 DIAGNOSIS — D229 Melanocytic nevi, unspecified: Secondary | ICD-10-CM | POA: Diagnosis not present

## 2021-07-18 DIAGNOSIS — D224 Melanocytic nevi of scalp and neck: Secondary | ICD-10-CM | POA: Diagnosis not present

## 2021-07-18 DIAGNOSIS — L821 Other seborrheic keratosis: Secondary | ICD-10-CM | POA: Diagnosis not present

## 2021-07-18 NOTE — Patient Instructions (Addendum)
Recommend over the counter Lotrimin or Lamisil. Prescription treatment would be Ketoconazole 2% cream. Please contact our office if you would like a prescription.  ? ? ? ?If You Need Anything After Your Visit ? ?If you have any questions or concerns for your doctor, please call our main line at [phone] and press option 4 to reach your doctor's medical assistant. If no one answers, please leave a voicemail as directed and we will return your call as soon as possible. Messages left after 4 pm will be answered the following business day.  ? ?You may also send [place] a message via MyChart. We typically respond to MyChart messages within 1-2 business days. ? ?For prescription refills, please ask your pharmacy to contact our office. Our fax number is [phone]. ? ?If you have an urgent issue when the clinic is closed that cannot wait until the next business day, you can page your doctor at the number below.   ? ?Please note that while we do our best to be available for urgent issues outside of office hours, we are not available 24/7.  ? ?If you have an urgent issue and are unable to reach [place], you may choose to seek medical care at your doctor's office, retail clinic, urgent care center, or emergency room. ? ?If you have a medical emergency, please immediately call 911 or go to the emergency department. ? ?Pager Numbers ? ?- Dr. Nehemiah Massed: [phone] ? ?- Dr. Laurence Ferrari: [phone] ? ?- Dr. Nicole Kindred: [phone] ? ?In the event of inclement weather, please call our main line at [phone] for an update on the status of any delays or closures. ? ?Dermatology Medication Tips: ?Please keep the boxes that topical medications come in in order to help keep track of the instructions about where and how to use these. Pharmacies typically print the medication instructions only on the boxes and not directly on the medication tubes.  ? ?If your medication is too expensive, please contact our office at [phone] option 4 or send  [place] a message through [place].  ? ?We are unable to tell what your co-pay for medications will be in advance as this is different depending on your insurance coverage. However, we may be able to find a substitute medication at lower cost or fill out paperwork to get insurance to cover a needed medication.  ? ?If a prior authorization is required to get your medication covered by your insurance company, please allow [place] 1-2 business days to complete this process. ? ?Drug prices often vary depending on where the prescription is filled and some pharmacies may offer cheaper prices. ? ?The website www.goodrx.com contains coupons for medications through different pharmacies. The prices here do not account for what the cost may be with help from insurance (it may be cheaper with your insurance), but the website can give you the price if you did not use any insurance.  ?- You can print the associated coupon and take it with your prescription to the pharmacy.  ?- You may also stop by our office during regular business hours and pick up a GoodRx coupon card.  ?- If you need your prescription sent electronically to a different pharmacy, notify our office through [facility] or by phone at [phone] option 4. ? ? ? ? ?Si Usted Necesita Algo Despu?s de Su Visita ? ?Tambi?n puede enviarnos un mensaje a trav?s de MyChart. Por lo general respondemos a los mensajes de MyChart en el transcurso de 1 a 2 d?as h?biles. ? ?  Para renovar recetas, por favor pida a su farmacia que se ponga en contacto con nuestra oficina. Nuestro n?mero de fax es el [phone]. ? ?Si tiene un asunto urgente cuando la cl?nica est? cerrada y que no puede esperar hasta el siguiente d?a h?bil, puede llamar/localizar a su doctor(a) al n?mero que aparece a continuaci?n.  ? ?Por favor, tenga en cuenta que aunque hacemos todo lo posible para estar disponibles para asuntos urgentes fuera del horario de oficina, no estamos disponibles las 24 horas del d?a,  los 7 d?as de la semana.  ? ?Si tiene un problema urgente y no puede comunicarse con nosotros, puede optar por buscar atenci?n m?dica  en el consultorio de su doctor(a), en una cl?nica privada, en un centro de atenci?n urgente o en una sala de emergencias. ? ?Si tiene Engineer, maintenance (IT) m?dica, por favor llame inmediatamente al 911 o vaya a la sala de emergencias. ? ?N?meros de b?per ? ?- Dr. Nehemiah Massed: [phone] ? ?- Dra. Moye: [phone] ? ?- Dra. Nicole Kindred: [phone] ? ?En caso de inclemencias del tiempo, por favor llame a nuestra l?nea principal al [phone] para una actualizaci?n sobre el estado de cualquier retraso o cierre. ? ?Consejos para la medicaci?n en dermatolog?a: ?Por favor, guarde las cajas en las que vienen los medicamentos de uso t?pico para ayudarle a seguir las instrucciones sobre d?nde y c?mo usarlos. Las farmacias generalmente imprimen las instrucciones del medicamento s?lo en las cajas y no directamente en los tubos del [place].  ? ?Si su medicamento es muy caro, por favor, p?ngase en contacto con Zigmund Daniel llamando al [phone] y presione la opci?n 4 o env?enos un mensaje a trav?s de MyChart.  ? ?No podemos decirle cu?l ser? su copago por los medicamentos por adelantado ya que esto es diferente dependiendo de la cobertura de su seguro. Sin embargo, es posible que podamos encontrar un medicamento sustituto a Electrical engineer un formulario para que el seguro cubra el medicamento que se considera necesario.  ? ?Si se requiere Ardelia Mems autorizaci?n previa para que su compa??a de seguros [place] su medicamento, por favor perm?tanos de 1 a 2 d?as h?biles para completar este proceso. ? ?Los precios de los medicamentos var?an con frecuencia dependiendo del Environmental consultant de d?nde se surte la receta y alguna farmacias pueden ofrecer precios m?s baratos. ? ?El sitio web www.goodrx.com tiene cupones para medicamentos de Airline pilot. Los precios aqu? no tienen en cuenta lo que podr?a costar  con la ayuda del seguro (puede ser m?s barato con su seguro), pero el sitio web puede darle el precio si no utiliz? ning?n seguro.  ?- Puede imprimir el cup?n correspondiente y llevarlo con su receta a la farmacia.  ?- Tambi?n puede pasar por nuestra oficina durante el horario de atenci?n regular y recoger una tarjeta de cupones de GoodRx.  ?- Si necesita que su receta se env?e electr?nicamente a Chiropodist, informe a nuestra oficina a trav?s de MyChart de [place] o por tel?fono llamando al [phone] y presione la opci?n 4. ? ?

## 2021-07-18 NOTE — Progress Notes (Signed)
   Follow-Up Visit   Subjective  Kyle Foley is a 26 y.o. male who presents for the following: Annual Exam. The patient presents for Total-Body Skin Exam (TBSE) for skin cancer screening and mole check.  The patient has spots, moles and lesions to be evaluated, some may be new or changing.  The following portions of the chart were reviewed this encounter and updated as appropriate:   Tobacco  Allergies  Meds  Problems  Med Hx  Surg Hx  Fam Hx     Review of Systems:  No other skin or systemic complaints except as noted in HPI or Assessment and Plan.  Objective  Well appearing patient in no apparent distress; mood and affect are within normal limits.  A full examination was performed including scalp, head, eyes, ears, nose, lips, neck, chest, axillae, abdomen, back, buttocks, bilateral upper extremities, bilateral lower extremities, hands, feet, fingers, toes, fingernails, and toenails. All findings within normal limits unless otherwise noted below.    L of midline ant scalp 0.7 cm tan flat papule.  R antecubital 0.6 x 0.4cm regular flat brown papule  L post base of neck 1.0 x 0.6cm verrucous tan flat papule  B/L foot Scaling and maceration web spaces and over distal and lateral soles.    Assessment & Plan  Nevus (3) R antecubital; L post base of neck; L of midline ant scalp Benign-appearing.  Observation.  Call clinic for new or changing moles.  Recommend daily use of broad spectrum spf 30+ sunscreen to sun-exposed areas.   Tinea pedis of both feet B/L foot Chronic and persistent condition with duration or expected duration over one year. Condition is symptomatic / bothersome to patient. Not to goal. Start OTC Lotrimin or Lamisil cream QD.  Pt defers Ketoconazole 2% cream at this time.  Skin cancer screening  Lentigines - Scattered tan macules - Due to sun exposure - Benign-appearing, observe - Recommend daily broad spectrum sunscreen SPF 30+ to  sun-exposed areas, reapply every 2 hours as needed. - Call for any changes  Seborrheic Keratoses - Stuck-on, waxy, tan-brown papules and/or plaques  - Benign-appearing - Discussed benign etiology and prognosis. - Observe - Call for any changes  Melanocytic Nevi - Tan-brown and/or pink-flesh-colored symmetric macules and papules - Benign appearing on exam today - Observation - Call clinic for new or changing moles - Recommend daily use of broad spectrum spf 30+ sunscreen to sun-exposed areas.   Hemangiomas - Red papules - Discussed benign nature - Observe - Call for any changes  Actinic Damage - Chronic condition, secondary to cumulative UV/sun exposure - diffuse scaly erythematous macules with underlying dyspigmentation - Recommend daily broad spectrum sunscreen SPF 30+ to sun-exposed areas, reapply every 2 hours as needed.  - Staying in the shade or wearing long sleeves, sun glasses (UVA+UVB protection) and wide brim hats (4-inch brim around the entire circumference of the hat) are also recommended for sun protection.  - Call for new or changing lesions.  Skin cancer screening performed today.  Return in about 3 years (around 07/18/2024) for TBSE.  Luther Redo, CMA, am acting as scribe for Sarina Ser, MD . Documentation: I have reviewed the above documentation for accuracy and completeness, and I agree with the above.  Sarina Ser, MD

## 2021-08-03 ENCOUNTER — Encounter: Payer: Self-pay | Admitting: Dermatology

## 2021-08-08 ENCOUNTER — Other Ambulatory Visit: Payer: Self-pay

## 2021-11-05 ENCOUNTER — Other Ambulatory Visit: Payer: Self-pay

## 2021-11-05 MED ORDER — PANTOPRAZOLE SODIUM 40 MG PO TBEC
DELAYED_RELEASE_TABLET | ORAL | 0 refills | Status: AC
Start: 2021-11-05 — End: ?
  Filled 2021-11-05: qty 90, 90d supply, fill #0

## 2021-12-28 DIAGNOSIS — K219 Gastro-esophageal reflux disease without esophagitis: Secondary | ICD-10-CM | POA: Diagnosis not present

## 2021-12-28 DIAGNOSIS — Z0000 Encounter for general adult medical examination without abnormal findings: Secondary | ICD-10-CM | POA: Diagnosis not present

## 2022-01-07 DIAGNOSIS — K219 Gastro-esophageal reflux disease without esophagitis: Secondary | ICD-10-CM | POA: Diagnosis not present

## 2022-01-07 DIAGNOSIS — Z0000 Encounter for general adult medical examination without abnormal findings: Secondary | ICD-10-CM | POA: Diagnosis not present

## 2022-02-04 ENCOUNTER — Other Ambulatory Visit: Payer: Self-pay

## 2022-02-04 MED ORDER — PANTOPRAZOLE SODIUM 40 MG PO TBEC
40.0000 mg | DELAYED_RELEASE_TABLET | Freq: Every day | ORAL | 1 refills | Status: AC
  Filled 2022-02-04: qty 90, 90d supply, fill #0
  Filled 2022-10-23: qty 90, 90d supply, fill #1

## 2022-05-03 ENCOUNTER — Other Ambulatory Visit: Payer: Self-pay

## 2022-05-03 MED ORDER — PANTOPRAZOLE SODIUM 40 MG PO TBEC
40.0000 mg | DELAYED_RELEASE_TABLET | Freq: Every day | ORAL | 1 refills | Status: AC
  Filled 2022-05-03: qty 90, 90d supply, fill #0

## 2022-07-29 ENCOUNTER — Other Ambulatory Visit: Payer: Self-pay

## 2022-07-29 MED ORDER — PANTOPRAZOLE SODIUM 40 MG PO TBEC
40.0000 mg | DELAYED_RELEASE_TABLET | Freq: Every day | ORAL | 1 refills | Status: AC
  Filled 2022-07-29: qty 90, 90d supply, fill #0

## 2022-10-23 ENCOUNTER — Other Ambulatory Visit: Payer: Self-pay

## 2023-01-02 DIAGNOSIS — Z0000 Encounter for general adult medical examination without abnormal findings: Secondary | ICD-10-CM | POA: Diagnosis not present

## 2023-01-09 DIAGNOSIS — E785 Hyperlipidemia, unspecified: Secondary | ICD-10-CM | POA: Diagnosis not present

## 2023-01-09 DIAGNOSIS — Z0000 Encounter for general adult medical examination without abnormal findings: Secondary | ICD-10-CM | POA: Diagnosis not present

## 2023-01-09 DIAGNOSIS — D649 Anemia, unspecified: Secondary | ICD-10-CM | POA: Diagnosis not present

## 2023-01-09 DIAGNOSIS — K219 Gastro-esophageal reflux disease without esophagitis: Secondary | ICD-10-CM | POA: Diagnosis not present

## 2023-01-17 ENCOUNTER — Other Ambulatory Visit: Payer: Self-pay

## 2023-01-17 MED ORDER — PANTOPRAZOLE SODIUM 40 MG PO TBEC
40.0000 mg | DELAYED_RELEASE_TABLET | Freq: Every day | ORAL | 1 refills | Status: AC
  Filled 2023-01-17: qty 90, 90d supply, fill #0
  Filled 2023-07-20: qty 90, 90d supply, fill #1

## 2023-01-27 DIAGNOSIS — D649 Anemia, unspecified: Secondary | ICD-10-CM | POA: Diagnosis not present

## 2023-04-22 ENCOUNTER — Other Ambulatory Visit: Payer: Self-pay

## 2023-04-22 MED ORDER — PANTOPRAZOLE SODIUM 40 MG PO TBEC
40.0000 mg | DELAYED_RELEASE_TABLET | Freq: Every day | ORAL | 1 refills | Status: AC
  Filled 2023-04-22: qty 90, 90d supply, fill #0

## 2023-10-04 DIAGNOSIS — S61112A Laceration without foreign body of left thumb with damage to nail, initial encounter: Secondary | ICD-10-CM | POA: Diagnosis not present

## 2023-10-04 DIAGNOSIS — Z23 Encounter for immunization: Secondary | ICD-10-CM | POA: Diagnosis not present

## 2023-10-20 ENCOUNTER — Other Ambulatory Visit: Payer: Self-pay

## 2023-10-20 MED ORDER — PANTOPRAZOLE SODIUM 40 MG PO TBEC
40.0000 mg | DELAYED_RELEASE_TABLET | Freq: Every day | ORAL | 1 refills | Status: AC
  Filled 2023-10-20: qty 90, 90d supply, fill #0

## 2024-01-06 DIAGNOSIS — E785 Hyperlipidemia, unspecified: Secondary | ICD-10-CM | POA: Diagnosis not present

## 2024-01-06 DIAGNOSIS — D649 Anemia, unspecified: Secondary | ICD-10-CM | POA: Diagnosis not present

## 2024-01-13 ENCOUNTER — Other Ambulatory Visit: Payer: Self-pay

## 2024-01-13 DIAGNOSIS — K219 Gastro-esophageal reflux disease without esophagitis: Secondary | ICD-10-CM | POA: Diagnosis not present

## 2024-01-13 DIAGNOSIS — Z0000 Encounter for general adult medical examination without abnormal findings: Secondary | ICD-10-CM | POA: Diagnosis not present

## 2024-01-13 DIAGNOSIS — Z1331 Encounter for screening for depression: Secondary | ICD-10-CM | POA: Diagnosis not present

## 2024-01-13 MED ORDER — PANTOPRAZOLE SODIUM 40 MG PO TBEC
40.0000 mg | DELAYED_RELEASE_TABLET | Freq: Every day | ORAL | 3 refills | Status: AC
Start: 2024-01-13 — End: ?
  Filled 2024-01-13: qty 90, 90d supply, fill #0

## 2024-04-16 ENCOUNTER — Other Ambulatory Visit: Payer: Self-pay

## 2024-04-16 MED ORDER — PANTOPRAZOLE SODIUM 40 MG PO TBEC
40.0000 mg | DELAYED_RELEASE_TABLET | ORAL | 3 refills | Status: AC
  Filled 2024-04-16: qty 90, 90d supply, fill #0
